# Patient Record
Sex: Male | Born: 1945
Health system: Southern US, Community
[De-identification: ages and names within clinical notes are randomized; demographics above are authoritative.]

## PROBLEM LIST (undated history)

## (undated) DIAGNOSIS — E669 Obesity, unspecified: Secondary | ICD-10-CM

## (undated) DIAGNOSIS — E878 Other disorders of electrolyte and fluid balance, not elsewhere classified: Secondary | ICD-10-CM

## (undated) DIAGNOSIS — H9190 Unspecified hearing loss, unspecified ear: Secondary | ICD-10-CM

## (undated) DIAGNOSIS — Z720 Tobacco use: Secondary | ICD-10-CM

## (undated) DIAGNOSIS — G4733 Obstructive sleep apnea (adult) (pediatric): Secondary | ICD-10-CM

## (undated) DIAGNOSIS — I10 Essential (primary) hypertension: Secondary | ICD-10-CM

## (undated) DIAGNOSIS — M109 Gout, unspecified: Secondary | ICD-10-CM

## (undated) DIAGNOSIS — N189 Chronic kidney disease, unspecified: Secondary | ICD-10-CM

## (undated) DIAGNOSIS — J449 Chronic obstructive pulmonary disease, unspecified: Secondary | ICD-10-CM

## (undated) DIAGNOSIS — R011 Cardiac murmur, unspecified: Secondary | ICD-10-CM

## (undated) DIAGNOSIS — M1990 Unspecified osteoarthritis, unspecified site: Secondary | ICD-10-CM

## (undated) DIAGNOSIS — I480 Paroxysmal atrial fibrillation: Secondary | ICD-10-CM

## (undated) DIAGNOSIS — E1140 Type 2 diabetes mellitus with diabetic neuropathy, unspecified: Secondary | ICD-10-CM

## (undated) HISTORY — DX: Unspecified osteoarthritis, unspecified site: M19.90

## (undated) HISTORY — DX: Gout, unspecified: M10.9

## (undated) HISTORY — PX: SINUS EXPLORATION: SHX5214

## (undated) HISTORY — PX: APPENDECTOMY: SHX54

## (undated) HISTORY — PX: OTHER SURGICAL HISTORY: SHX169

## (undated) HISTORY — PX: HERNIA REPAIR: SHX51

## (undated) HISTORY — DX: Chronic kidney disease, unspecified: N18.9

## (undated) HISTORY — DX: Other disorders of electrolyte and fluid balance, not elsewhere classified: E87.8

---

## 2000-03-04 ENCOUNTER — Other Ambulatory Visit: Admission: RE | Admit: 2000-03-04 | Discharge: 2000-03-04 | Payer: Self-pay | Admitting: Otolaryngology

## 2011-06-25 ENCOUNTER — Inpatient Hospital Stay (HOSPITAL_COMMUNITY)
Admission: EM | Admit: 2011-06-25 | Discharge: 2011-06-26 | DRG: 683 | Disposition: A | Discharge status: Home / Self Care | Payer: Medicare Other | Attending: Internal Medicine | Admitting: Internal Medicine

## 2011-06-25 ENCOUNTER — Other Ambulatory Visit: Payer: Self-pay

## 2011-06-25 ENCOUNTER — Emergency Department (HOSPITAL_COMMUNITY): Payer: Medicare Other

## 2011-06-25 ENCOUNTER — Encounter (HOSPITAL_COMMUNITY): Payer: Self-pay | Admitting: Emergency Medicine

## 2011-06-25 DIAGNOSIS — N179 Acute kidney failure, unspecified: Principal | ICD-10-CM | POA: Diagnosis present

## 2011-06-25 DIAGNOSIS — N19 Unspecified kidney failure: Secondary | ICD-10-CM

## 2011-06-25 DIAGNOSIS — E1142 Type 2 diabetes mellitus with diabetic polyneuropathy: Secondary | ICD-10-CM | POA: Diagnosis present

## 2011-06-25 DIAGNOSIS — Z6841 Body Mass Index (BMI) 40.0 and over, adult: Secondary | ICD-10-CM

## 2011-06-25 DIAGNOSIS — E1149 Type 2 diabetes mellitus with other diabetic neurological complication: Secondary | ICD-10-CM | POA: Diagnosis present

## 2011-06-25 DIAGNOSIS — R011 Cardiac murmur, unspecified: Secondary | ICD-10-CM | POA: Diagnosis present

## 2011-06-25 DIAGNOSIS — Z720 Tobacco use: Secondary | ICD-10-CM | POA: Diagnosis present

## 2011-06-25 DIAGNOSIS — E669 Obesity, unspecified: Secondary | ICD-10-CM | POA: Diagnosis present

## 2011-06-25 DIAGNOSIS — G4733 Obstructive sleep apnea (adult) (pediatric): Secondary | ICD-10-CM | POA: Diagnosis present

## 2011-06-25 DIAGNOSIS — J4489 Other specified chronic obstructive pulmonary disease: Secondary | ICD-10-CM | POA: Diagnosis present

## 2011-06-25 DIAGNOSIS — E119 Type 2 diabetes mellitus without complications: Secondary | ICD-10-CM | POA: Diagnosis present

## 2011-06-25 DIAGNOSIS — Z794 Long term (current) use of insulin: Secondary | ICD-10-CM

## 2011-06-25 DIAGNOSIS — I959 Hypotension, unspecified: Secondary | ICD-10-CM | POA: Diagnosis present

## 2011-06-25 DIAGNOSIS — F17200 Nicotine dependence, unspecified, uncomplicated: Secondary | ICD-10-CM | POA: Diagnosis present

## 2011-06-25 DIAGNOSIS — I480 Paroxysmal atrial fibrillation: Secondary | ICD-10-CM | POA: Diagnosis present

## 2011-06-25 DIAGNOSIS — J449 Chronic obstructive pulmonary disease, unspecified: Secondary | ICD-10-CM | POA: Diagnosis present

## 2011-06-25 DIAGNOSIS — Z7901 Long term (current) use of anticoagulants: Secondary | ICD-10-CM

## 2011-06-25 DIAGNOSIS — J329 Chronic sinusitis, unspecified: Secondary | ICD-10-CM | POA: Diagnosis present

## 2011-06-25 DIAGNOSIS — H9190 Unspecified hearing loss, unspecified ear: Secondary | ICD-10-CM | POA: Diagnosis present

## 2011-06-25 DIAGNOSIS — Z79899 Other long term (current) drug therapy: Secondary | ICD-10-CM

## 2011-06-25 DIAGNOSIS — E1140 Type 2 diabetes mellitus with diabetic neuropathy, unspecified: Secondary | ICD-10-CM | POA: Diagnosis present

## 2011-06-25 DIAGNOSIS — I4891 Unspecified atrial fibrillation: Secondary | ICD-10-CM | POA: Diagnosis present

## 2011-06-25 DIAGNOSIS — E861 Hypovolemia: Secondary | ICD-10-CM | POA: Diagnosis present

## 2011-06-25 DIAGNOSIS — I10 Essential (primary) hypertension: Secondary | ICD-10-CM | POA: Diagnosis present

## 2011-06-25 HISTORY — DX: Obesity, unspecified: E66.9

## 2011-06-25 HISTORY — DX: Essential (primary) hypertension: I10

## 2011-06-25 HISTORY — DX: Unspecified hearing loss, unspecified ear: H91.90

## 2011-06-25 HISTORY — DX: Cardiac murmur, unspecified: R01.1

## 2011-06-25 HISTORY — DX: Type 2 diabetes mellitus with diabetic neuropathy, unspecified: E11.40

## 2011-06-25 HISTORY — DX: Tobacco use: Z72.0

## 2011-06-25 HISTORY — DX: Chronic obstructive pulmonary disease, unspecified: J44.9

## 2011-06-25 HISTORY — DX: Obstructive sleep apnea (adult) (pediatric): G47.33

## 2011-06-25 HISTORY — DX: Paroxysmal atrial fibrillation: I48.0

## 2011-06-25 MED ORDER — SODIUM CHLORIDE 0.9 % IV SOLN
Freq: Once | INTRAVENOUS | Status: AC
  Administered 2011-06-25: via INTRAVENOUS

## 2011-06-25 NOTE — ED Provider Notes (Signed)
History  Scribed for Leota Jacobsen, MD, the patient was seen in room APA09/APA09. This chart was scribed by Truddie Coco. The patient's care started at 11:10 PM    CSN: [id number]  Arrival date & time 06/25/11  2130   First MD Initiated Contact with Patient 06/25/11 2301      Chief Complaint  Patient presents with  . Hypotension     The history is provided by the patient.   Craig Black is a 66 y.o. male who presents to the Emergency Department complaining of generalized weakness and hypotension.  Pt states that for the past week he has felt more tired than usual and three hours ago felt lightheaded and reports his blood pressure was 80/60 at the time.  He denies headache, abdominal pain, chest pain, or SOB.  He states that he feels like he is staggering while walking.  Pt also reports that he was started on coumadin three months ago.    He denies any other sx.  He reports h/o heart murmur.  Nothing seems to improve or worsen sx.      Past Medical History  Diagnosis Date  . Murmur   . Hypertension   . Diabetes mellitus     Past Surgical History  Procedure Date  . Appendectomy   . Hernia repair     No family history on file.  History  Substance Use Topics  . Smoking status: Current Everyday Smoker -- 2.0 packs/day  . Smokeless tobacco: Not on file  . Alcohol Use: No      Review of Systems  Constitutional: Positive for activity change.  Respiratory: Negative for shortness of breath.   Cardiovascular: Negative for chest pain.  Gastrointestinal: Positive for abdominal pain.  Neurological: Positive for weakness. Negative for headaches.  All other systems reviewed and are negative.    Allergies  Review of patient's allergies indicates no known allergies.  Home Medications  No current outpatient prescriptions on file.  BP 144/69  Pulse 103  Temp(Src) 97.9 F (36.6 C) (Oral)  Resp 20  Ht 5\' 6"  (1.676 m)  Wt 265 lb (120.203 kg)  BMI 42.77 kg/m2  SpO2  98%  Physical Exam  Nursing note and vitals reviewed. Constitutional: He is oriented to person, place, and time. He appears well-developed and well-nourished. No distress.  HENT:  Head: Normocephalic and atraumatic.  Eyes: EOM are normal. Right eye exhibits no discharge. Left eye exhibits no discharge.  Neck: Normal range of motion. Neck supple.  Cardiovascular:  No murmur heard.      irregular  Pulmonary/Chest: Effort normal. He has no wheezes. He has no rales.  Musculoskeletal: He exhibits no tenderness.  Neurological: He is alert and oriented to person, place, and time.  Skin: Skin is warm and dry. He is not diaphoretic.  Psychiatric: He has a normal mood and affect. His behavior is normal.    ED Course  Procedures   DIAGNOSTIC STUDIES: Oxygen Saturation is 98% on room air, normal by my interpretation.    COORDINATION OF CARE:  11:17PM Ordered: Comprehensive metabolic panel ; EKG [id number] ; Orthostatic vital signs ; 0.9 % sodium chloride infusion ; DG Chest 2 View ; Protime-INR ; Sample to Blood Bank    Labs Reviewed - No data to display No results found.   No diagnosis found.    MDM   Date: 06/25/2011  Rate: 96  Rhythm: atrial fibrillation  QRS Axis: normal  Intervals: normal  ST/T Wave abnormalities: nonspecific T  wave changes  Conduction Disutrbances:atrial fibrillation  Narrative Interpretation:   Old EKG Reviewed: none available   I personally performed the services described in this documentation, which was scribed in my presence. The recorded information has been reviewed and considered.   3:23 AM Patient given IV fluids here. Patient's renal function noted and he will be admitted for hydration     Leota Jacobsen, MD 06/26/11 [phone]

## 2011-06-25 NOTE — ED Notes (Signed)
Pt states he was hypotensive at 80/50 over the last two days.

## 2011-06-26 ENCOUNTER — Encounter (HOSPITAL_COMMUNITY): Payer: Self-pay | Admitting: Internal Medicine

## 2011-06-26 ENCOUNTER — Inpatient Hospital Stay (HOSPITAL_COMMUNITY): Payer: Medicare Other

## 2011-06-26 DIAGNOSIS — E1140 Type 2 diabetes mellitus with diabetic neuropathy, unspecified: Secondary | ICD-10-CM | POA: Diagnosis present

## 2011-06-26 DIAGNOSIS — Z720 Tobacco use: Secondary | ICD-10-CM | POA: Diagnosis present

## 2011-06-26 DIAGNOSIS — E669 Obesity, unspecified: Secondary | ICD-10-CM | POA: Diagnosis present

## 2011-06-26 DIAGNOSIS — J329 Chronic sinusitis, unspecified: Secondary | ICD-10-CM | POA: Diagnosis present

## 2011-06-26 DIAGNOSIS — I10 Essential (primary) hypertension: Secondary | ICD-10-CM | POA: Diagnosis present

## 2011-06-26 DIAGNOSIS — N179 Acute kidney failure, unspecified: Secondary | ICD-10-CM | POA: Diagnosis present

## 2011-06-26 DIAGNOSIS — I480 Paroxysmal atrial fibrillation: Secondary | ICD-10-CM | POA: Diagnosis present

## 2011-06-26 DIAGNOSIS — J449 Chronic obstructive pulmonary disease, unspecified: Secondary | ICD-10-CM | POA: Diagnosis present

## 2011-06-26 DIAGNOSIS — G4733 Obstructive sleep apnea (adult) (pediatric): Secondary | ICD-10-CM | POA: Diagnosis present

## 2011-06-26 DIAGNOSIS — E119 Type 2 diabetes mellitus without complications: Secondary | ICD-10-CM | POA: Diagnosis present

## 2011-06-26 DIAGNOSIS — H9190 Unspecified hearing loss, unspecified ear: Secondary | ICD-10-CM | POA: Diagnosis present

## 2011-06-26 LAB — COMPREHENSIVE METABOLIC PANEL
AST: 19 U/L (ref 0–37)
AST: 15 U/L (ref 0–37)
Albumin: 3.7 g/dL (ref 3.5–5.2)
BUN: 44 mg/dL — ABNORMAL HIGH (ref 6–23)
CO2: 24 mEq/L (ref 19–32)
Calcium: 9.2 mg/dL (ref 8.4–10.5)
Calcium: 8.8 mg/dL (ref 8.4–10.5)
Chloride: 105 mEq/L (ref 96–112)
Creatinine, Ser: 2.17 mg/dL — ABNORMAL HIGH (ref 0.50–1.35)
Creatinine, Ser: 1.96 mg/dL — ABNORMAL HIGH (ref 0.50–1.35)
GFR calc Af Amer: 39 mL/min — ABNORMAL LOW (ref >90)
GFR calc non Af Amer: 34 mL/min — ABNORMAL LOW (ref >90)
Glucose, Bld: 207 mg/dL — ABNORMAL HIGH (ref 70–99)
Total Bilirubin: 0.2 mg/dL — ABNORMAL LOW (ref 0.3–1.2)
Total Protein: 7.1 g/dL (ref 6.0–8.3)

## 2011-06-26 LAB — DIFFERENTIAL
Basophils Absolute: 0.1 10*3/uL (ref 0.0–0.1)
Basophils Relative: 1 % (ref 0–1)
Eosinophils Relative: 3 % (ref 0–5)
Monocytes Absolute: 0.8 10*3/uL (ref 0.1–1.0)
Neutro Abs: 5.6 10*3/uL (ref 1.7–7.7)

## 2011-06-26 LAB — HEMOGLOBIN A1C
Hgb A1c MFr Bld: 6.4 % — ABNORMAL HIGH (ref <5.7)
Mean Plasma Glucose: 137 mg/dL — ABNORMAL HIGH (ref <117)

## 2011-06-26 LAB — CBC
HCT: 40.2 % (ref 39.0–52.0)
MCHC: 35.6 g/dL (ref 30.0–36.0)
MCV: 92.2 fL (ref 78.0–100.0)
RDW: 13.9 % (ref 11.5–15.5)

## 2011-06-26 LAB — GLUCOSE, CAPILLARY: Glucose-Capillary: 187 mg/dL — ABNORMAL HIGH (ref 70–99)

## 2011-06-26 LAB — CARDIAC PANEL(CRET KIN+CKTOT+MB+TROPI): Relative Index: INVALID (ref 0.0–2.5)

## 2011-06-26 LAB — APTT: aPTT: 34 seconds (ref 24–37)

## 2011-06-26 LAB — SAMPLE TO BLOOD BANK

## 2011-06-26 LAB — PROTIME-INR: INR: 1.65 — ABNORMAL HIGH (ref 0.00–1.49)

## 2011-06-26 MED ORDER — ACETAMINOPHEN 325 MG PO TABS: 650.0000 mg | ORAL_TABLET | Freq: Four times a day (QID) | ORAL | Status: DC | PRN

## 2011-06-26 MED ORDER — SODIUM CHLORIDE 0.9 % IJ SOLN: 3.0000 mL | Freq: Two times a day (BID) | INTRAMUSCULAR | Status: DC

## 2011-06-26 MED ORDER — IPRATROPIUM BROMIDE 0.02 % IN SOLN
0.5000 mg | RESPIRATORY_TRACT | Status: DC
  Administered 2011-06-26: 0.5 mg via RESPIRATORY_TRACT
  Filled 2011-06-26 (×2): qty 2.5

## 2011-06-26 MED ORDER — TRAZODONE HCL 50 MG PO TABS: 25.0000 mg | ORAL_TABLET | Freq: Every evening | ORAL | Status: DC | PRN

## 2011-06-26 MED ORDER — WARFARIN SODIUM 10 MG PO TABS: 10.0000 mg | ORAL_TABLET | Freq: Once | ORAL | Status: DC

## 2011-06-26 MED ORDER — GABAPENTIN 300 MG PO CAPS
300.0000 mg | ORAL_CAPSULE | Freq: Two times a day (BID) | ORAL | Status: DC
  Administered 2011-06-26: 300 mg via ORAL
  Filled 2011-06-26: qty 1

## 2011-06-26 MED ORDER — ONDANSETRON HCL 4 MG PO TABS: 4.0000 mg | ORAL_TABLET | Freq: Four times a day (QID) | ORAL | Status: DC | PRN

## 2011-06-26 MED ORDER — INSULIN ASPART PROT & ASPART (70-30 MIX) 100 UNIT/ML ~~LOC~~ SUSP
35.0000 [IU] | Freq: Two times a day (BID) | SUBCUTANEOUS | Status: DC
  Administered 2011-06-26: 35 [IU] via SUBCUTANEOUS
  Filled 2011-06-26: qty 10
  Filled 2011-06-26: qty 3

## 2011-06-26 MED ORDER — ONDANSETRON HCL 4 MG/2ML IJ SOLN: 4.0000 mg | Freq: Four times a day (QID) | INTRAMUSCULAR | Status: DC | PRN

## 2011-06-26 MED ORDER — INSULIN ASPART 100 UNIT/ML ~~LOC~~ SOLN
0.0000 [IU] | Freq: Three times a day (TID) | SUBCUTANEOUS | Status: DC
  Administered 2011-06-26: 4 [IU] via SUBCUTANEOUS

## 2011-06-26 MED ORDER — INSULIN ASPART PROT & ASPART (70-30 MIX) 100 UNIT/ML ~~LOC~~ SUSP: 40.0000 [IU] | Freq: Two times a day (BID) | SUBCUTANEOUS | Status: DC

## 2011-06-26 MED ORDER — SODIUM CHLORIDE 0.9 % IV SOLN
INTRAVENOUS | Status: DC
  Administered 2011-06-26: 06:00:00 via INTRAVENOUS

## 2011-06-26 MED ORDER — HYDROMORPHONE HCL PF 1 MG/ML IJ SOLN: 0.5000 mg | INTRAMUSCULAR | Status: DC | PRN

## 2011-06-26 MED ORDER — OXYCODONE HCL 5 MG PO TABS: 5.0000 mg | ORAL_TABLET | ORAL | Status: DC | PRN

## 2011-06-26 MED ORDER — ACETAMINOPHEN 650 MG RE SUPP: 650.0000 mg | Freq: Four times a day (QID) | RECTAL | Status: DC | PRN

## 2011-06-26 MED ORDER — ATORVASTATIN CALCIUM 20 MG PO TABS: 20.0000 mg | ORAL_TABLET | Freq: Every day | ORAL | Status: DC

## 2011-06-26 MED ORDER — ASPIRIN EC 81 MG PO TBEC
81.0000 mg | DELAYED_RELEASE_TABLET | Freq: Every day | ORAL | Status: DC
  Administered 2011-06-26: 81 mg via ORAL
  Filled 2011-06-26: qty 1

## 2011-06-26 MED ORDER — ALBUTEROL SULFATE (5 MG/ML) 0.5% IN NEBU
2.5000 mg | INHALATION_SOLUTION | Freq: Four times a day (QID) | RESPIRATORY_TRACT | Status: DC | PRN
  Administered 2011-06-26: 2.5 mg via RESPIRATORY_TRACT
  Filled 2011-06-26 (×2): qty 0.5

## 2011-06-26 MED ORDER — SODIUM CHLORIDE 0.9 % IV BOLUS (SEPSIS)
1000.0000 mL | Freq: Once | INTRAVENOUS | Status: AC
  Administered 2011-06-26: 1000 mL via INTRAVENOUS

## 2011-06-26 MED ORDER — INSULIN ASPART 100 UNIT/ML ~~LOC~~ SOLN: 0.0000 [IU] | Freq: Every day | SUBCUTANEOUS | Status: DC

## 2011-06-26 MED ORDER — METOPROLOL TARTRATE 50 MG PO TABS: 50.0000 mg | ORAL_TABLET | Freq: Two times a day (BID) | ORAL | Status: AC

## 2011-06-26 MED ORDER — FLUTICASONE PROPIONATE 50 MCG/ACT NA SUSP
2.0000 | Freq: Every day | NASAL | Status: DC
  Administered 2011-06-26: 2 via NASAL
  Filled 2011-06-26: qty 16

## 2011-06-26 MED ORDER — WARFARIN - PHARMACIST DOSING INPATIENT: Freq: Every day | Status: DC

## 2011-06-26 NOTE — ED Notes (Signed)
Patient lying in bed sleeping at this time. No complaints of pain or obvious distress noted.

## 2011-06-26 NOTE — H&P (Signed)
PCP:   Dr. Vicenta Aly at the [facility] outpatient clinic   Chief Complaint:  Feeling faint yesterday evening   Progressive weakness or 2 days; neqarly fainted 5pm  Started mucinex DM 2 days ago  Sinuses draining x 6-7 years  Dr. Dr. Vicenta Aly, [facility]      HPI: Craig Black is an 66 y.o. male.  [place] veteran, diabetic, hypertensive, morbid obesity, obstructive sleep apnea, compliant with CPAP. Medications include lisinopril and HCTZ.  Has been feeling tired and fatigued for the past 2 days, then at about 5 PM yesterday evening he passed out; checked his blood pressure and found it to be in the [id number] systolic, and that caused him to come to the emergency room to be evaluated.   Emergency room he received intravenous fluid resuscitation and feels much better feels he could go home, but blood work reveals evidence of renal failure and the hospitalist service has been called to assist with management.  She denies any history of kidney disease, he denies any over-the-counter medication use such as Advil or Aleve, but has been using Mucinex for his chronic. Denies vomiting or diarrhea; denies any black or bloody stool.  Has been taking his antihypertensives has prescribed, does not think it is possible that he took more than the prescribed dose; in fact he says his lisinopril was recently reduced because it gave him palpitations at the full dose.  Does take Coumadin for axis more atrial fibrillation.  Does smoke one half packs of cigarettes per day and has been smoking since about age 39.  Because of his experiences in [place] he is hard of hearing in his right ear.  Rewiew of Systems:  The patient denies anorexia, fever, weight loss,, vision loss, decreased hearing, hoarseness, chest pain, syncope, dyspnea on exertion, peripheral edema, balance deficits, hemoptysis, abdominal pain, melena, hematochezia, severe indigestion/heartburn, hematuria, incontinence, genital sores, muscle weakness,  suspicious skin lesions, transient blindness, difficulty walking, depression, unusual weight change, abnormal bleeding, enlarged lymph nodes, angioedema, and breast masses.   Past Medical History  Diagnosis Date  . Murmur   . Hypertension   . Diabetes mellitus   . Chronic sinusitis   . OSA (obstructive sleep apnea)   . Paroxysmal a-fib   . Neuropathy in diabetes   . COPD (chronic obstructive pulmonary disease)   . Tobacco abuse   . Hard of hearing     Right ear  . Obesity     Past Surgical History  Procedure Date  . Appendectomy   . Hernia repair   . Cataract     right lens replacement  . Sinus exploration     1998    Medications:  HOME MEDS: Prior to Admission medications   Medication Sig Start Date End Date Taking? Authorizing Provider  albuterol (PROVENTIL HFA) 108 (90 BASE) MCG/ACT inhaler Inhale 2 puffs into the lungs every 6 (six) hours as needed.   Yes Historical Provider, MD  amLODipine (NORVASC) 10 MG tablet Take 10 mg by mouth daily.   Yes Historical Provider, MD  fluticasone (FLONASE) 50 MCG/ACT nasal spray Place 2 sprays into the nose daily.   Yes Historical Provider, MD  gabapentin (NEURONTIN) 300 MG capsule Take 300 mg by mouth 2 (two) times daily.   Yes Historical Provider, MD  hydrochlorothiazide (HYDRODIURIL) 25 MG tablet Take 25 mg by mouth every morning.   Yes Historical Provider, MD  insulin aspart (NOVOLOG) 100 UNIT/ML injection Inject 10 Units into the skin 3 (three) times daily  before meals.   Yes Historical Provider, MD  insulin NPH-insulin regular (NOVOLIN 70/30) (70-30) 100 UNIT/ML injection Inject 40 Units into the skin daily with supper.   Yes Historical Provider, MD  insulin NPH-insulin regular (NOVOLIN 70/30) (70-30) 100 UNIT/ML injection Inject 50 Units into the skin daily with breakfast.   Yes Historical Provider, MD  lisinopril (PRINIVIL,ZESTRIL) 40 MG tablet Take 20 mg by mouth every morning.   Yes Historical Provider, MD  Multiple  Vitamins-Minerals (CENTRUM SILVER ULTRA MENS PO) Take 1 tablet by mouth every morning.   Yes Historical Provider, MD  rosuvastatin (CRESTOR) 20 MG tablet Take 10 mg by mouth every evening.   Yes Historical Provider, MD  warfarin (COUMADIN) 10 MG tablet Take 10 mg by mouth daily. Take one & one-half tablet on Mondays and Fridays. Take one tablet on all other days   Yes Historical Provider, MD     Allergies:  No Known Allergies  Social History:   reports that he has been smoking Cigarettes.  He has a 90 pack-year smoking history. He does not have any smokeless tobacco history on file. He reports that he does not drink alcohol or use illicit drugs.  Family History: Family History  Problem Relation Age of Onset  . Adopted: Yes     Physical Exam: Filed Vitals:   06/25/11 2138 06/26/11 0000 06/26/11 0002 06/26/11 0003  BP: 144/69 133/73 128/62 116/57  Pulse: 103 98 92 107  Temp: 97.9 F (36.6 C)     TempSrc: Oral     Resp: 20     Height: 5\' 6"  (1.676 m)     Weight: 120.203 kg (265 lb)     SpO2: 98%      Blood pressure 116/57, pulse 107, temperature 97.9 F (36.6 C), temperature source Oral, resp. rate 20, height 5\' 6"  (1.676 m), weight 120.203 kg (265 lb), SpO2 98.00%.  GEN:  Pleasant morbidly obese Caucasian man lying in the stretcher in no acute distress; cooperative with exam PSYCH:  alert and oriented x4; does not appear anxious does not appear depressed; affect is normal HEENT: Mucous membranes pink dry and anicteric; PERRLA; EOM intact; very thick neck no cervical lymphadenopathy nor thyromegaly or carotid bruit; no JVD; Breasts:: Not examined CHEST WALL: No tenderness CHEST: Normal respiration, clear to auscultation bilaterally HEART: Irregularly irregular rhythm; no murmurs rubs or gallops BACK: No kyphosis or scoliosis; no CVA tenderness ABDOMEN: Obese, soft non-tender; no masses, no organomegaly, normal abdominal bowel sounds;  no intertriginous candida. Rectal Exam:  Not done EXTREMITIES: ; age-appropriate arthropathy of the hands and knees; no edema; no ulcerations. Genitalia: not examined PULSES: 2+ and symmetric SKIN: Normal hydration no rash or ulceration CNS: Cranial nerves 2-12 grossly intact no focal neurologic deficit   Labs & Imaging Results for orders placed during the hospital encounter of 06/25/11 (from the past 48 hour(s))  CBC     Status: Normal   Collection Time   06/25/11 11:18 PM      Component Value Range Comment   WBC 9.5  4.0 - 10.5 (K/uL)    RBC 4.36  4.22 - 5.81 (MIL/uL)    Hemoglobin 14.3  13.0 - 17.0 (g/dL)    HCT 40.2  39.0 - 52.0 (%)    MCV 92.2  78.0 - 100.0 (fL)    MCH 32.8  26.0 - 34.0 (pg)    MCHC 35.6  30.0 - 36.0 (g/dL)    RDW 13.9  11.5 - 15.5 (%)    Platelets  229  150 - 400 (K/uL)   DIFFERENTIAL     Status: Normal   Collection Time   06/25/11 11:18 PM      Component Value Range Comment   Neutrophils Relative 58  43 - 77 (%)    Neutro Abs 5.6  1.7 - 7.7 (K/uL)    Lymphocytes Relative 30  12 - 46 (%)    Lymphs Abs 2.8  0.7 - 4.0 (K/uL)    Monocytes Relative 9  3 - 12 (%)    Monocytes Absolute 0.8  0.1 - 1.0 (K/uL)    Eosinophils Relative 3  0 - 5 (%)    Eosinophils Absolute 0.2  0.0 - 0.7 (K/uL)    Basophils Relative 1  0 - 1 (%)    Basophils Absolute 0.1  0.0 - 0.1 (K/uL)   COMPREHENSIVE METABOLIC PANEL     Status: Abnormal   Collection Time   06/25/11 11:18 PM      Component Value Range Comment   Sodium 140  135 - 145 (mEq/L) POST-ULTRACENTRIFUGATION   Potassium 4.3  3.5 - 5.1 (mEq/L)    Chloride 102  96 - 112 (mEq/L)    CO2 21  19 - 32 (mEq/L)    Glucose, Bld 92  70 - 99 (mg/dL)    BUN 44 (*) 6 - 23 (mg/dL)    Creatinine, Ser 2.17 (*) 0.50 - 1.35 (mg/dL)    Calcium 9.2  8.4 - 10.5 (mg/dL)    Total Protein 7.1  6.0 - 8.3 (g/dL)    Albumin 3.7  3.5 - 5.2 (g/dL)    AST 19  0 - 37 (U/L)    ALT 19  0 - 53 (U/L)    Alkaline Phosphatase 70  39 - 117 (U/L)    Total Bilirubin 0.2 (*) 0.3 - 1.2 (mg/dL)      GFR calc non Af Amer 30 (*) >90 (mL/min)    GFR calc Af Amer 35 (*) >90 (mL/min)   PROTIME-INR     Status: Abnormal   Collection Time   06/25/11 11:18 PM      Component Value Range Comment   Prothrombin Time 19.8 (*) 11.6 - 15.2 (seconds)    INR 1.65 (*) 0.00 - 1.49    SAMPLE TO BLOOD BANK     Status: Normal   Collection Time   06/26/11 12:24 AM      Component Value Range Comment   Blood Bank Specimen SAMPLE AVAILABLE FOR TESTING      Sample Expiration 06/29/2011      Dg Chest 2 View  06/26/2011  *RADIOLOGY REPORT*  Clinical Data: Hypotension  CHEST - 2 VIEW  Comparison: None.  Findings: Normal heart size.  Left base nodular density.  Lungs otherwise clear.  No pneumothorax and no pleural effusion.  Right costophrenic angle not included on the PA view limiting the exam.  IMPRESSION: Left base nodular density.  Repeat with nipple markers is recommended.  Original Report Authenticated By: Jamas Lav, M.D.      Assessment Present on Admission:  .Diabetes mellitus .Acute renal failure .Hypertension .Chronic sinusitis .OSA (obstructive sleep apnea) .Paroxysmal a-fib .Neuropathy in diabetes .COPD (chronic obstructive pulmonary disease) .Tobacco abuse .Hard of hearing .Obesity   PLAN: Admit this gentleman for hydration, discontinue nephrotoxic drugs for the time being; does have potassium other part of his medications but has not been taking them.  Temporarily Reduce his dose 70/30 insulin while he is in renal failure.  And son the  importance of weight loss and tobacco cessation during his many chronic medical problems.  Have pharmacy assist with dosing his Coumadin since his INR is subtherapeutic  She'll assistance with finding a local doctor who takes Medicaid, so consult a social worker has been sent  Other plans as per orders.    Manolito Jurewicz 06/26/2011, 4:21 AM

## 2011-06-26 NOTE — Progress Notes (Signed)
Discharge instructions reviewed with patient, patient voiced understanding. Patient given discharge instructions and prescriptions. Patient in stable condition.

## 2011-06-26 NOTE — ED Notes (Signed)
Dr Campbell at bedside.

## 2011-06-26 NOTE — Consult Note (Signed)
ANTICOAGULATION CONSULT NOTE - Initial Consult  Pharmacy Consult for Warfarin  Indication: atrial fibrillation  No Known Allergies  Patient Measurements: Height: 5\' 6"  (167.6 cm) Weight: 276 lb 3.2 oz (125.283 kg) IBW/kg (Calculated) : 63.8   Vital Signs: Temp: 97.8 F (36.6 C) (03/13 0533) Temp src: Oral (03/13 0533) BP: 154/67 mmHg (03/13 0534) Pulse Rate: 103  (03/13 0534)  Labs:  Basename 06/26/11 0550 06/26/11 0520 06/25/11 2318  HGB -- -- 14.3  HCT -- -- 40.2  PLT -- -- 229  APTT -- 34 --  LABPROT -- 20.5* 19.8*  INR -- 1.72* 1.65*  HEPARINUNFRC -- -- --  CREATININE 1.96* -- 2.17*  CKTOTAL -- 41 --  CKMB -- 1.5 --  TROPONINI -- <0.30 --   Estimated Creatinine Clearance: 46.4 ml/min (by C-G formula based on Cr of 1.96).  Medical History: Past Medical History  Diagnosis Date  . Murmur   . Hypertension   . Diabetes mellitus   . Chronic sinusitis   . OSA (obstructive sleep apnea)   . Paroxysmal Black-fib   . Neuropathy in diabetes   . COPD (chronic obstructive pulmonary disease)   . Tobacco abuse   . Hard of hearing     Right ear  . Obesity    Medications:  Prescriptions prior to admission  Medication Sig Dispense Refill  . albuterol (PROVENTIL HFA) 108 (90 BASE) MCG/ACT inhaler Inhale 2 puffs into the lungs every 6 (six) hours as needed.      Marland Kitchen amLODipine (NORVASC) 10 MG tablet Take 10 mg by mouth daily.      . fluticasone (FLONASE) 50 MCG/ACT nasal spray Place 2 sprays into the nose daily.      Marland Kitchen gabapentin (NEURONTIN) 300 MG capsule Take 300 mg by mouth 2 (two) times daily.      . hydrochlorothiazide (HYDRODIURIL) 25 MG tablet Take 25 mg by mouth every morning.      . insulin aspart (NOVOLOG) 100 UNIT/ML injection Inject 10 Units into the skin 3 (three) times daily before meals.      . insulin NPH-insulin regular (NOVOLIN 70/30) (70-30) 100 UNIT/ML injection Inject 40 Units into the skin daily with supper.      . insulin NPH-insulin regular (NOVOLIN  70/30) (70-30) 100 UNIT/ML injection Inject 50 Units into the skin daily with breakfast.      . lisinopril (PRINIVIL,ZESTRIL) 40 MG tablet Take 20 mg by mouth every morning.      . Multiple Vitamins-Minerals (CENTRUM SILVER ULTRA MENS PO) Take 1 tablet by mouth every morning.      . rosuvastatin (CRESTOR) 20 MG tablet Take 10 mg by mouth every evening.      . warfarin (COUMADIN) 10 MG tablet Take 10 mg by mouth daily. Take one & one-half tablet on Mondays and Fridays. Take one tablet on all other days        Assessment: INR below goal  Goal of Therapy:  INR 2-3   Plan: Coumadin 10mg  today x 1 INR daily until stable  Craig Black 06/26/2011,8:01 AM

## 2011-06-26 NOTE — Discharge Summary (Signed)
Physician Discharge Summary  Patient ID: Craig Black MRN: [id number] DOB/AGE: 1945-11-28 66 y.o. y.o.  Admit date: 06/25/2011 Discharge date: 06/26/2011    Discharge Diagnoses:  1. Hypotension secondary to hypovolemia/dehydration. Improved. 2. Acute renal failure, improving, secondary to #1. 3. Obesity. 4. Hypertension. 5. COPD. 6. Type 2 diabetes mellitus with peripheral neuropathy. 7. Obstructive sleep apnea. 8. Tobacco abuse.   Medication List  As of 06/26/2011  9:37 AM   STOP taking these medications         CENTRUM SILVER ULTRA MENS PO      hydrochlorothiazide 25 MG tablet      lisinopril 40 MG tablet         TAKE these medications         amLODipine 10 MG tablet   Commonly known as: NORVASC   Take 10 mg by mouth daily.      fluticasone 50 MCG/ACT nasal spray   Commonly known as: FLONASE   Place 2 sprays into the nose daily.      gabapentin 300 MG capsule   Commonly known as: NEURONTIN   Take 300 mg by mouth 2 (two) times daily.      insulin aspart 100 UNIT/ML injection   Commonly known as: novoLOG   Inject 10 Units into the skin 3 (three) times daily before meals.      insulin NPH-insulin regular (70-30) 100 UNIT/ML injection   Commonly known as: NOVOLIN 70/30   Inject 40 Units into the skin daily with supper.      insulin NPH-insulin regular (70-30) 100 UNIT/ML injection   Commonly known as: NOVOLIN 70/30   Inject 50 Units into the skin daily with breakfast.      metoprolol 50 MG tablet   Commonly known as: LOPRESSOR   Take 1 tablet (50 mg total) by mouth 2 (two) times daily.      PROVENTIL HFA 108 (90 BASE) MCG/ACT inhaler   Generic drug: albuterol   Inhale 2 puffs into the lungs every 6 (six) hours as needed.      rosuvastatin 20 MG tablet   Commonly known as: CRESTOR   Take 10 mg by mouth every evening.      warfarin 10 MG tablet   Commonly known as: COUMADIN   Take 10 mg by mouth daily. Take one & one-half tablet on Mondays and Fridays. Take  one tablet on all other days            Discharged Condition: Stable.    Consults: None.  Significant Diagnostic Studies: Dg Chest 2 View  06/26/2011  *RADIOLOGY REPORT*  Clinical Data: Hypotension  CHEST - 2 VIEW  Comparison: None.  Findings: Normal heart size.  Left base nodular density.  Lungs otherwise clear.  No pneumothorax and no pleural effusion.  Right costophrenic angle not included on the PA view limiting the exam.  IMPRESSION: Left base nodular density.  Repeat with nipple markers is recommended.  Original Report Authenticated By: Jamas Lav, M.D.    Lab Results: Basic Metabolic Panel:  Basename 06/26/11 0550 06/25/11 2318  NA 140 140  K 3.9 4.3  CL 105 102  CO2 24 21  GLUCOSE 207* 92  BUN 44* 44*  CREATININE 1.96* 2.17*  CALCIUM 8.8 9.2  MG -- --  PHOS -- --   Liver Function Tests:  Basename 06/26/11 0550 06/25/11 2318  AST 15 19  ALT 20 19  ALKPHOS 75 70  BILITOT 0.2* 0.2*  PROT 7.2 7.1  ALBUMIN  3.6 3.7     CBC:  Basename 06/25/11 2318  WBC 9.5  NEUTROABS 5.6  HGB 14.3  HCT 40.2  MCV 92.2  PLT 229       Hospital Course: This 66 year old man was admitted yesterday hypotensive. He did not have an episode of loss of consciousness but almost did. When he presented to the hospital he was hypotensive and in acute renal failure. He was felt to also be clinically dehydrated. He was given intravenous fluids in the emergency room whereupon he began to fell much improved. Overnight has been admitted and has been in IV fluids at 150 cc an hour. He feels back to his usual self now. Recently, his dose of ACE inhibitor was decreased by half because he was feeling lightheaded. It has been discontinued in the hospital. He denies any chest pain, increased dyspnea or palpitations.  Discharge Exam: Blood pressure 154/67, pulse 103, temperature 97.8 F (36.6 C), temperature source Oral, resp. rate 22, height 5\' 6"  (1.676 m), weight 125.283 kg (276 lb 3.2  oz), SpO2 92.00%. He is obese and looks chronically sick. However he is not septic or toxic. Heart sounds are present and normal. Lung fields are clear. There is no wheezing, crackles or bronchial breathing. He is alert and orientated without any focal neurologic signs.  Disposition: Home. His renal function is not back to normal, however I do not know what his baseline is. We have stopped his ACE inhibitor and hydrochlorothiazide. In its place I have given a prescription for metoprolol 50 mg twice a day. She will continue this with Norvasc 10 mg daily. Clearly  his blood pressure and renal function needs to be monitored closely and I have asked him to followup with his primary care physician in the next week or 2.  Discharge Orders    Future Orders Please Complete By Expires   Diet - low sodium heart healthy      Increase activity slowly           SignedDoree Albee Pager [phone]  06/26/2011, 9:37 AM

## 2011-06-26 NOTE — Progress Notes (Signed)
Patient in stable condition and transported out by RN.

## 2011-06-26 NOTE — ED Notes (Signed)
Family at bedside. Patient walked to the restroom and back to bed with no problems.

## 2011-07-30 ENCOUNTER — Emergency Department (HOSPITAL_COMMUNITY)
Admission: EM | Admit: 2011-07-30 | Discharge: 2011-07-30 | Disposition: A | Discharge status: Home / Self Care | Payer: Medicare Other | Attending: Emergency Medicine | Admitting: Emergency Medicine

## 2011-07-30 ENCOUNTER — Encounter (HOSPITAL_COMMUNITY): Payer: Self-pay

## 2011-07-30 ENCOUNTER — Emergency Department (HOSPITAL_COMMUNITY): Payer: Medicare Other

## 2011-07-30 DIAGNOSIS — X500XXA Overexertion from strenuous movement or load, initial encounter: Secondary | ICD-10-CM | POA: Insufficient documentation

## 2011-07-30 DIAGNOSIS — S20219A Contusion of unspecified front wall of thorax, initial encounter: Secondary | ICD-10-CM

## 2011-07-30 DIAGNOSIS — I10 Essential (primary) hypertension: Secondary | ICD-10-CM | POA: Insufficient documentation

## 2011-07-30 DIAGNOSIS — E119 Type 2 diabetes mellitus without complications: Secondary | ICD-10-CM | POA: Insufficient documentation

## 2011-07-30 DIAGNOSIS — R079 Chest pain, unspecified: Secondary | ICD-10-CM | POA: Insufficient documentation

## 2011-07-30 DIAGNOSIS — F17200 Nicotine dependence, unspecified, uncomplicated: Secondary | ICD-10-CM | POA: Insufficient documentation

## 2011-07-30 DIAGNOSIS — Z794 Long term (current) use of insulin: Secondary | ICD-10-CM | POA: Insufficient documentation

## 2011-07-30 DIAGNOSIS — Z7901 Long term (current) use of anticoagulants: Secondary | ICD-10-CM | POA: Insufficient documentation

## 2011-07-30 NOTE — ED Notes (Signed)
C/o right rib pain worse with inspiration. Pt was working on truck and laid on right side and heard " snap"

## 2011-07-30 NOTE — ED Notes (Signed)
Pain rt ribs. No distress,

## 2011-07-30 NOTE — Discharge Instructions (Signed)
Chest Contusion You have been checked for injuries to your chest. Your caregiver has not found injuries serious enough to require hospitalization. It is common to have bruises and sore muscles after an injury. These tend to feel worse the first 24 hours. You may gradually develop more stiffness and soreness over the next several hours to several days. This usually feels worse the first morning following your injury. After a few days, you will usually begin to improve. The amount of improvement depends on the amount of damage. Following the accident, if the pain in any area continues to increase or you develop new areas of pain, you should see your primary caregiver or return to the Emergency Department for re-evaluation. HOME CARE INSTRUCTIONS   Put ice on sore areas every 2 hours for 20 minutes while awake for the next 2 days.   Drink extra fluids. Do not drink alcohol.   Activity as tolerated. Lifting may make pain worse.   Only take over-the-counter or prescription medicines for pain, discomfort, or fever as directed by your caregiver. Do not use aspirin. This may increase bruising or increase bleeding.  SEEK IMMEDIATE MEDICAL CARE IF:   There is a worsening of any of the problems that brought you in for care.   Shortness of breath, dizziness or fainting develop.   You have chest pain, difficulty breathing, or develop pain going down the left arm or up into jaw.   You feel sick to your stomach (nausea), vomiting or sweats.   You have increasing belly (abdominal) discomfort.   There is blood in your urine, stool, or if you vomit blood.   There is pain in either shoulder in an area where a shoulder strap would be.   You have feelings of lightheadedness, or if you should have a fainting episode.   You have numbness, tingling, weakness, or problems with the use of your arms or legs.   Severe headaches not relieved with medications develop.   You have a change in bowel or bladder  control.   There is increasing pain in any areas of the body.  If you feel your symptoms are worsening, and you are not able to see your primary caregiver, return to the Emergency Department immediately. MAKE SURE YOU:   Understand these instructions.   Will watch your condition.   Will get help right away if you are not doing well or get worse.  Document Released: 12/25/2000 Document Revised: 03/21/2011 Document Reviewed: 11/18/2007 [facility] Patient Information 2012 [place].Rib Contusion A rib contusion (bruise) can occur by a blow to the chest or by a fall against a hard object. Usually these will be much better in a couple weeks. If X-rays were taken today and there are no broken bones (fractures), the diagnosis of bruising is made. However, broken ribs may not show up for several days, or may be discovered later on a routine X-ray when signs of healing show up. If this happens to you, it does not mean that something was missed on the X-ray, but simply that it did not show up on the first X-rays. Earlier diagnosis will not usually change the treatment. HOME CARE INSTRUCTIONS   Avoid strenuous activity. Be careful during activities and avoid bumping the injured ribs. Activities that pull on the injured ribs and cause pain should be avoided, if possible.   For the first day or two, an ice pack used every 20 minutes while awake may be helpful. Put ice in a plastic bag  and put a towel between the bag and the skin.   Eat a normal, well-balanced diet. Drink plenty of fluids to avoid constipation.   Take deep breaths several times a day to keep lungs free of infection. Try to cough several times a day. Splint the injured area with a pillow while coughing to ease pain. Coughing can help prevent pneumonia.   Wear a rib belt or binder only if told to do so by your caregiver. If you are wearing a rib belt or binder, you must do the breathing exercises as directed by your caregiver. If not  used properly, rib belts or binders restrict breathing which can lead to pneumonia.   Only take over-the-counter or prescription medicines for pain, discomfort, or fever as directed by your caregiver.  SEEK MEDICAL CARE IF:   You or your child has an oral temperature above 102 F (38.9 C).   Your baby is older than 3 months with a rectal temperature of 100.5 F (38.1 C) or higher for more than 1 day.   You develop a cough, with thick or bloody sputum.  SEEK IMMEDIATE MEDICAL CARE IF:   You have difficulty breathing.   You feel sick to your stomach (nausea), have vomiting or belly (abdominal) pain.   You have worsening pain, not controlled with medications, or there is a change in the location of the pain.   You develop sweating or radiation of the pain into the arms, jaw or shoulders, or become light headed or faint.   You or your child has an oral temperature above 102 F (38.9 C), not controlled by medicine.   Your or your baby is older than 3 months with a rectal temperature of 102 F (38.9 C) or higher.   Your baby is 50 months old or younger with a rectal temperature of 100.4 F (38 C) or higher.  MAKE SURE YOU:   Understand these instructions.   Will watch your condition.   Will get help right away if you are not doing well or get worse.  Document Released: 12/25/2000 Document Revised: 03/21/2011 Document Reviewed: 11/18/2007 [facility] Patient Information 2012 [place].

## 2011-07-31 NOTE — ED Provider Notes (Signed)
History     CSN: [id number]  Arrival date & time 07/30/11  1611   First MD Initiated Contact with Patient 07/30/11 1703      Chief Complaint  Patient presents with  . Rib Injury    (Consider location/radiation/quality/duration/timing/severity/associated sxs/prior treatment) HPI Comments: Patient c/o localized pain to his right lateral ribs for 2 weeks.  States that pain began while he was leaning over a truck working on the motor, and felt a "pop" to his ribs.  Pain is worse with certain movements, deep breathing and coughing.  Improves with rest.  He denies abd pain, hematuria, or dyspnea  Patient is a 66 y.o. male presenting with chest pain. The history is provided by the patient.  Chest Pain The chest pain began 1 - 2 weeks ago. Chest pain occurs constantly. The chest pain is unchanged. The pain is associated with breathing, coughing, exertion and lifting. The severity of the pain is mild. The quality of the pain is described as aching. The pain does not radiate. Chest pain is worsened by certain positions, deep breathing and exertion. Pertinent negatives for primary symptoms include no fever, no fatigue, no syncope, no shortness of breath, no cough, no abdominal pain, no nausea, no vomiting, no dizziness and no altered mental status.  Pertinent negatives for associated symptoms include no lower extremity edema, no numbness and no orthopnea. He tried nothing for the symptoms.     Past Medical History  Diagnosis Date  . Murmur   . Hypertension   . Diabetes mellitus   . Chronic sinusitis   . OSA (obstructive sleep apnea)   . Paroxysmal a-fib   . Neuropathy in diabetes   . COPD (chronic obstructive pulmonary disease)   . Tobacco abuse   . Hard of hearing     Right ear  . Obesity     Past Surgical History  Procedure Date  . Appendectomy   . Hernia repair   . Cataract     right lens replacement  . Sinus exploration     1998    Family History  Problem Relation Age of  Onset  . Adopted: Yes    History  Substance Use Topics  . Smoking status: Current Everyday Smoker -- 1.5 packs/day for 60 years    Types: Cigarettes  . Smokeless tobacco: Not on file  . Alcohol Use: No      Review of Systems  Constitutional: Negative for fever and fatigue.  HENT: Negative for neck pain.   Respiratory: Negative for cough, chest tightness and shortness of breath.   Cardiovascular: Positive for chest pain. Negative for orthopnea and syncope.  Gastrointestinal: Negative for nausea, vomiting and abdominal pain.  Musculoskeletal: Negative.   Skin: Negative.   Neurological: Negative for dizziness and numbness.  Psychiatric/Behavioral: Negative for altered mental status.  All other systems reviewed and are negative.    Allergies  Review of patient's allergies indicates no known allergies.  Home Medications   Current Outpatient Rx  Name Route Sig Dispense Refill  . ALBUTEROL SULFATE HFA 108 (90 BASE) MCG/ACT IN AERS Inhalation Inhale 2 puffs into the lungs every 6 (six) hours as needed.    Marland Kitchen AMLODIPINE BESYLATE 10 MG PO TABS Oral Take 10 mg by mouth daily.    Marland Kitchen FLUTICASONE PROPIONATE 50 MCG/ACT NA SUSP Nasal Place 2 sprays into the nose daily.    . FUROSEMIDE 20 MG PO TABS Oral Take 20 mg by mouth daily.    Marland Kitchen GABAPENTIN 300 MG  PO CAPS Oral Take 300 mg by mouth 2 (two) times daily.    . INSULIN ASPART 100 UNIT/ML Martin SOLN Subcutaneous Inject 10 Units into the skin 3 (three) times daily before meals.    . INSULIN ISOPHANE & REGULAR (70-30) 100 UNIT/ML [place] SUSP Subcutaneous Inject 50 Units into the skin 2 (two) times daily.     Marland Kitchen METOPROLOL TARTRATE 50 MG PO TABS Oral Take 1 tablet (50 mg total) by mouth 2 (two) times daily. 60 tablet 0  . ROSUVASTATIN CALCIUM 20 MG PO TABS Oral Take 10 mg by mouth every evening.    . WARFARIN SODIUM 10 MG PO TABS Oral Take 10 mg by mouth daily. Take one & one-half tablet on Mondays and Fridays. Take one tablet on all other days       BP 134/60  Pulse 76  Temp(Src) 98.1 F (36.7 C) (Oral)  Resp 20  Ht 5\' 6"  (1.676 m)  Wt 265 lb (120.203 kg)  BMI 42.77 kg/m2  SpO2 98%  Physical Exam  Nursing note and vitals reviewed. Constitutional: He is oriented to person, place, and time. He appears well-developed and well-nourished. No distress.  HENT:  Head: Normocephalic and atraumatic.  Neck: Normal range of motion. Neck supple.  Cardiovascular: Normal rate, regular rhythm, normal heart sounds and intact distal pulses.   No murmur heard. Pulmonary/Chest: Effort normal and breath sounds normal. Chest wall is not dull to percussion. He exhibits tenderness. He exhibits no mass, no crepitus, no edema, no deformity, no swelling and no retraction.         Localized ttp of the lateral right ribs.  No edema, bruising or crepitus  Abdominal: Soft. He exhibits no distension. There is no tenderness. There is no rebound and no guarding.  Neurological: He is alert and oriented to person, place, and time. He exhibits normal muscle tone. Coordination normal.  Skin: Skin is warm and dry.    ED Course  Procedures (including critical care time)  Labs Reviewed - No data to display Dg Ribs Unilateral W/chest Right  07/30/2011  *RADIOLOGY REPORT*  Clinical Data: Injury to the right ribs 3 weeks ago.  RIGHT RIBS AND CHEST - 3+ VIEW  Comparison: Plain films of the chest 06/26/2010.  Findings: Lungs are clear.  No pneumothorax or pleural effusion. Heart size is normal.  No rib fracture is present.  IMPRESSION: Negative study.  Original Report Authenticated By: Arvid Right. D'ALESSIO, M.D.     1. Chest wall contusion       MDM    Vitals stable.  Patient ambulates well.  Lungs are CTA bilaterally.  He agrees to f/u with his PMD if needed or return here if needed.   Patient / Family / Caregiver understand and agree with initial ED impression and plan with expectations set for ED visit. Pt stable in ED with no significant deterioration  in condition. Pt feels improved after observation and/or treatment in ED.    Kalisha Keadle L. [place], [place] 07/31/11 1414

## 2011-08-01 NOTE — ED Provider Notes (Signed)
Medical screening examination/treatment/procedure(s) were performed by non-physician practitioner and as supervising physician I was immediately available for consultation/collaboration.   Mylinda Latina III, MD 08/01/11 [phone]

## 2012-06-26 IMAGING — CR DG RIBS W/ CHEST 3+V*R*
5 series · 5 of 5 positions shown · non-contrast
Comparison: Plain films of the chest 06/26/2010.

CLINICAL DATA: Injury to the right ribs 3 weeks ago.

RIGHT RIBS AND CHEST - 3+ VIEW

[view not recorded (1 of 5)]
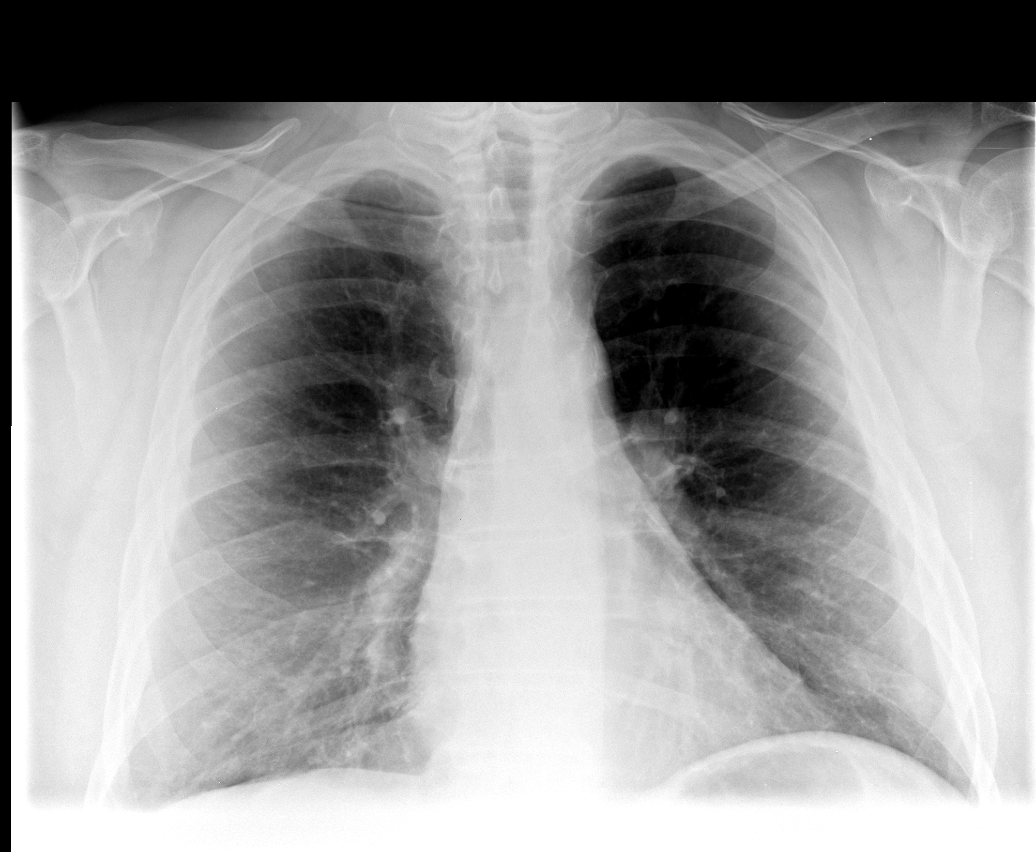

[view not recorded (2 of 5)]
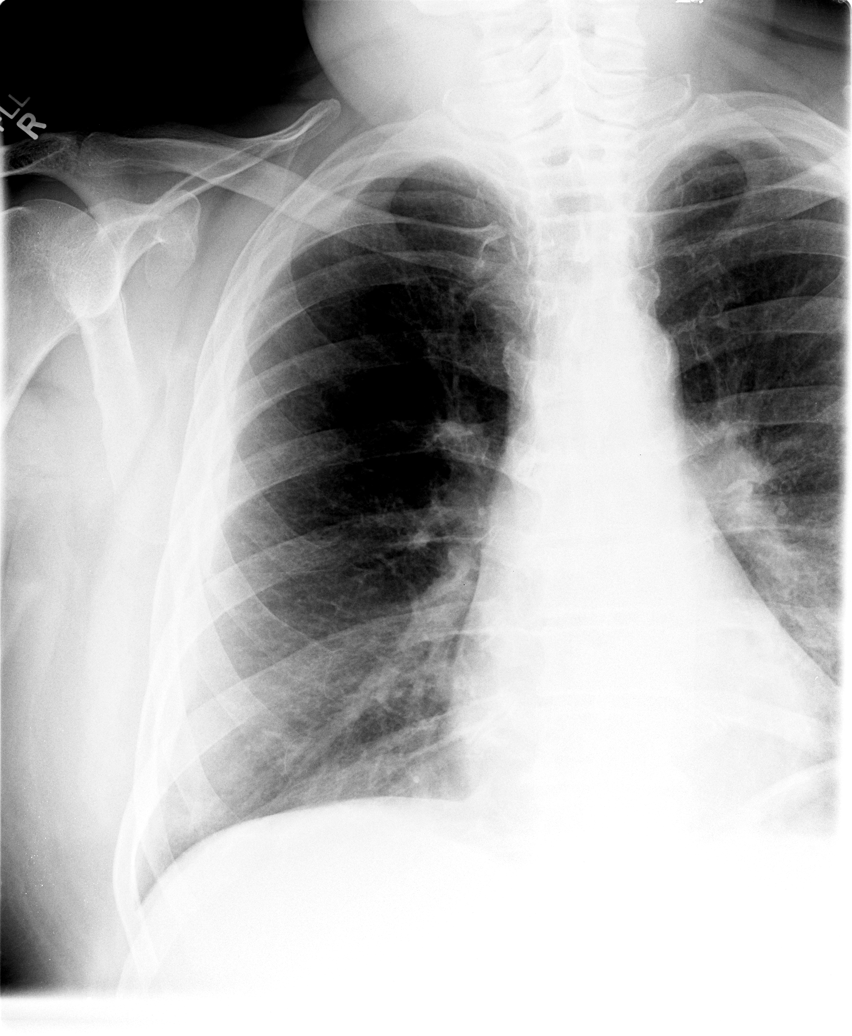

[view not recorded (3 of 5)]
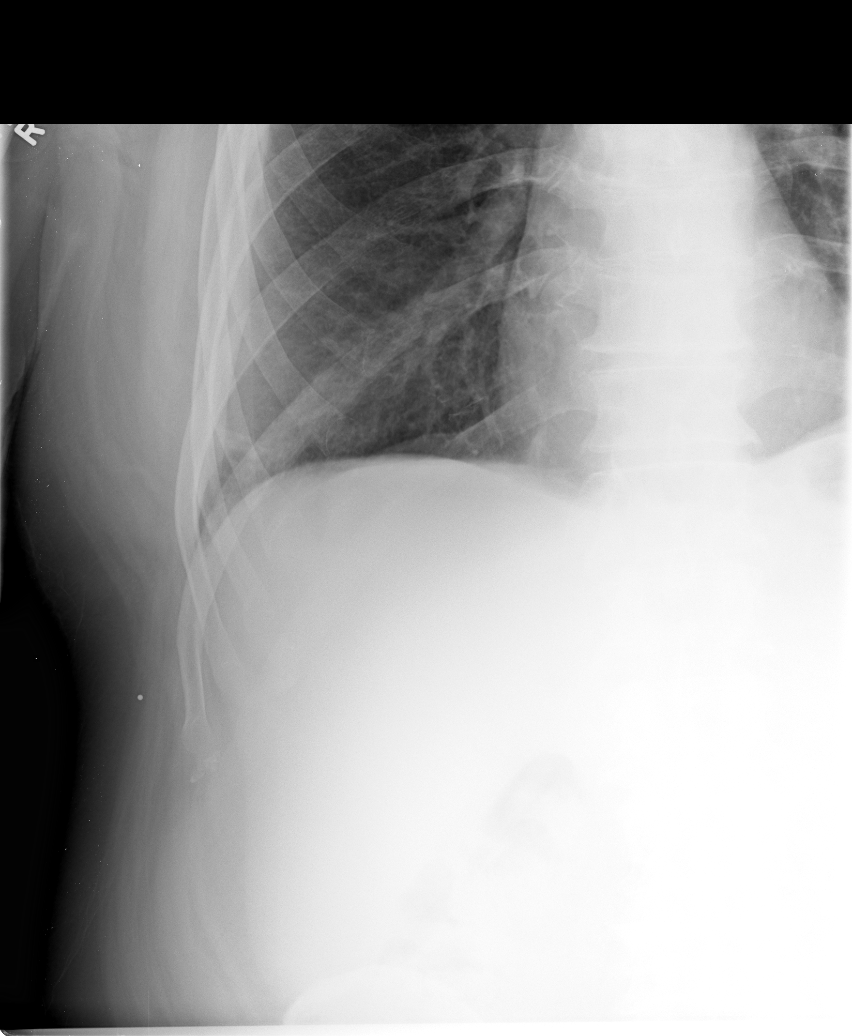

[view not recorded (4 of 5)]
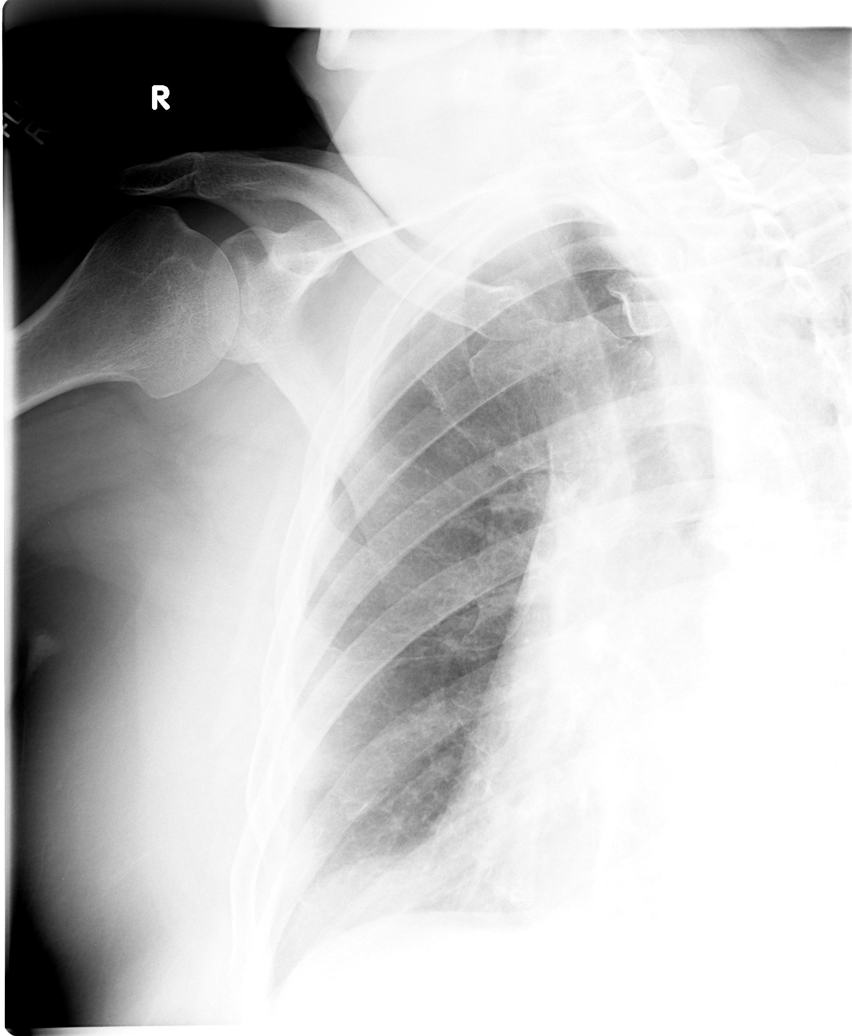

[view not recorded (5 of 5)]
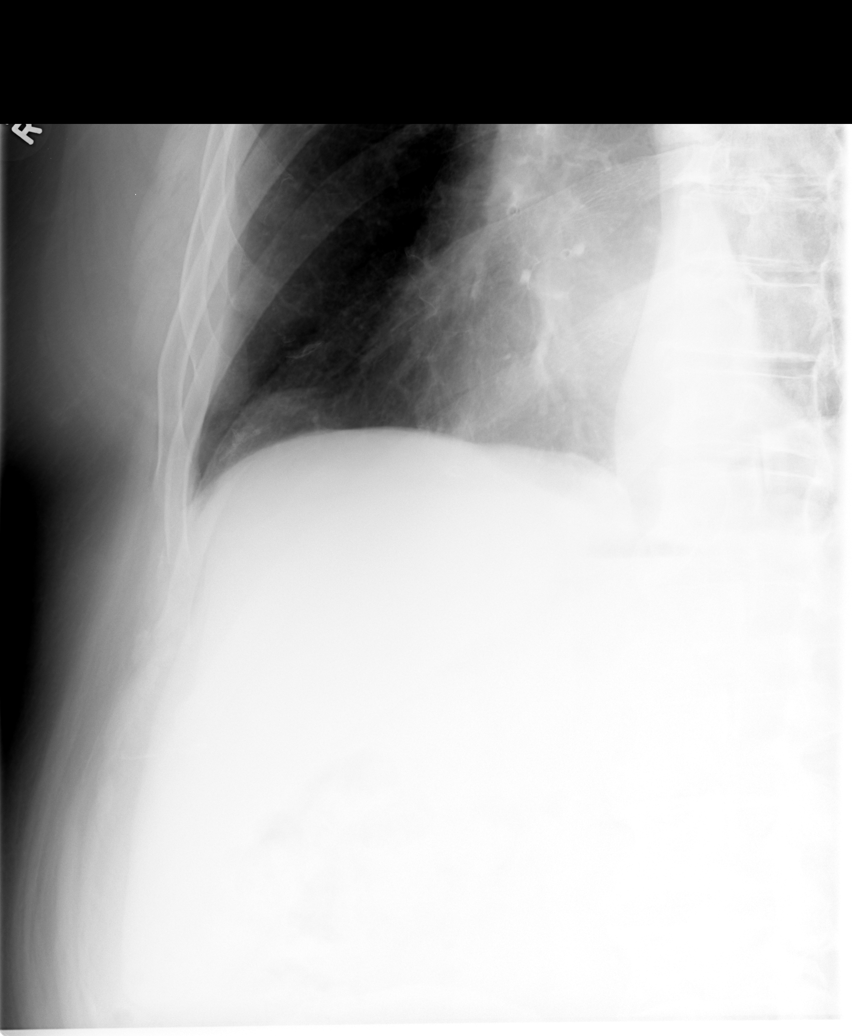

[5 of 5 positions shown; findings below may reference images not displayed]

FINDINGS: Lungs are clear.  No pneumothorax or pleural effusion.
Heart size is normal.  No rib fracture is present.
IMPRESSION: Negative study.

## 2017-11-14 ENCOUNTER — Emergency Department (HOSPITAL_COMMUNITY)
Admission: EM | Admit: 2017-11-14 | Discharge: 2017-11-14 | Disposition: A | Discharge status: Home / Self Care | Payer: Medicare Other | Attending: Emergency Medicine | Admitting: Emergency Medicine

## 2017-11-14 ENCOUNTER — Emergency Department (HOSPITAL_COMMUNITY): Payer: Medicare Other

## 2017-11-14 ENCOUNTER — Encounter (HOSPITAL_COMMUNITY): Payer: Self-pay

## 2017-11-14 DIAGNOSIS — R296 Repeated falls: Secondary | ICD-10-CM | POA: Insufficient documentation

## 2017-11-14 DIAGNOSIS — F17290 Nicotine dependence, other tobacco product, uncomplicated: Secondary | ICD-10-CM | POA: Diagnosis not present

## 2017-11-14 DIAGNOSIS — Z794 Long term (current) use of insulin: Secondary | ICD-10-CM | POA: Diagnosis not present

## 2017-11-14 DIAGNOSIS — M6281 Muscle weakness (generalized): Secondary | ICD-10-CM | POA: Diagnosis not present

## 2017-11-14 DIAGNOSIS — R0602 Shortness of breath: Secondary | ICD-10-CM | POA: Diagnosis present

## 2017-11-14 DIAGNOSIS — J40 Bronchitis, not specified as acute or chronic: Secondary | ICD-10-CM

## 2017-11-14 DIAGNOSIS — Z79899 Other long term (current) drug therapy: Secondary | ICD-10-CM | POA: Insufficient documentation

## 2017-11-14 DIAGNOSIS — J449 Chronic obstructive pulmonary disease, unspecified: Secondary | ICD-10-CM | POA: Insufficient documentation

## 2017-11-14 DIAGNOSIS — I10 Essential (primary) hypertension: Secondary | ICD-10-CM | POA: Insufficient documentation

## 2017-11-14 DIAGNOSIS — E119 Type 2 diabetes mellitus without complications: Secondary | ICD-10-CM | POA: Diagnosis not present

## 2017-11-14 DIAGNOSIS — R531 Weakness: Secondary | ICD-10-CM

## 2017-11-14 DIAGNOSIS — Z7901 Long term (current) use of anticoagulants: Secondary | ICD-10-CM | POA: Insufficient documentation

## 2017-11-14 LAB — CBC WITH DIFFERENTIAL/PLATELET
Basophils Absolute: 0 10*3/uL (ref 0.0–0.1)
Basophils Relative: 0 %
Eosinophils Absolute: 0 10*3/uL (ref 0.0–0.7)
Eosinophils Relative: 0 %
HCT: 45.6 % (ref 39.0–52.0)
Hemoglobin: 16.1 g/dL (ref 13.0–17.0)
Lymphocytes Relative: 12 %
Lymphs Abs: 0.7 10*3/uL (ref 0.7–4.0)
MCH: 32.9 pg (ref 26.0–34.0)
MCHC: 35.3 g/dL (ref 30.0–36.0)
MCV: 93.3 fL (ref 78.0–100.0)
Monocytes Absolute: 0.7 10*3/uL (ref 0.1–1.0)
Monocytes Relative: 12 %
Neutro Abs: 4.2 10*3/uL (ref 1.7–7.7)
Neutrophils Relative %: 76 %
Platelets: 141 10*3/uL — ABNORMAL LOW (ref 150–400)
RBC: 4.89 MIL/uL (ref 4.22–5.81)
RDW: 13.5 % (ref 11.5–15.5)
WBC: 5.6 10*3/uL (ref 4.0–10.5)

## 2017-11-14 LAB — URINALYSIS, ROUTINE W REFLEX MICROSCOPIC
Bacteria, UA: NONE SEEN
Bilirubin Urine: NEGATIVE
Glucose, UA: NEGATIVE mg/dL
Hgb urine dipstick: NEGATIVE
Ketones, ur: NEGATIVE mg/dL
Leukocytes, UA: NEGATIVE
Nitrite: NEGATIVE
Protein, ur: 100 mg/dL — AB
Specific Gravity, Urine: 1.012 (ref 1.005–1.030)
pH: 6 (ref 5.0–8.0)

## 2017-11-14 LAB — BASIC METABOLIC PANEL
Anion gap: 10 (ref 5–15)
BUN: 24 mg/dL — ABNORMAL HIGH (ref 8–23)
CO2: 27 mmol/L (ref 22–32)
Calcium: 9 mg/dL (ref 8.9–10.3)
Chloride: 100 mmol/L (ref 98–111)
Creatinine, Ser: 1.59 mg/dL — ABNORMAL HIGH (ref 0.61–1.24)
GFR calc Af Amer: 48 mL/min — ABNORMAL LOW (ref >60)
GFR calc non Af Amer: 41 mL/min — ABNORMAL LOW (ref >60)
Glucose, Bld: 173 mg/dL — ABNORMAL HIGH (ref 70–99)
Potassium: 4 mmol/L (ref 3.5–5.1)
Sodium: 137 mmol/L (ref 135–145)

## 2017-11-14 LAB — I-STAT CG4 LACTIC ACID, ED: Lactic Acid, Venous: 1.36 mmol/L (ref 0.5–1.9)

## 2017-11-14 MED ORDER — SODIUM CHLORIDE 0.9 % IV BOLUS
500.0000 mL | Freq: Once | INTRAVENOUS | Status: AC
  Administered 2017-11-14: 500 mL via INTRAVENOUS

## 2017-11-14 MED ORDER — ACETAMINOPHEN 325 MG PO TABS
650.0000 mg | ORAL_TABLET | Freq: Once | ORAL | Status: AC
  Administered 2017-11-14: 650 mg via ORAL
  Filled 2017-11-14: qty 2

## 2017-11-14 MED ORDER — PREDNISONE 20 MG PO TABS: 40.0000 mg | ORAL_TABLET | Freq: Every day | ORAL | 0 refills | Status: DC

## 2017-11-14 MED ORDER — CIPROFLOXACIN HCL 250 MG PO TABS
500.0000 mg | ORAL_TABLET | Freq: Once | ORAL | Status: AC
  Administered 2017-11-14: 500 mg via ORAL
  Filled 2017-11-14: qty 2

## 2017-11-14 MED ORDER — LEVOFLOXACIN 500 MG PO TABS: 500.0000 mg | ORAL_TABLET | Freq: Every day | ORAL | 0 refills | Status: DC

## 2017-11-14 NOTE — ED Notes (Signed)
Pt cannot provide urine sample at this time.  

## 2017-11-14 NOTE — ED Provider Notes (Signed)
[facility] EMERGENCY DEPARTMENT Provider Note   CSN: [id number] Arrival date & time: 11/14/17  6314     History   Chief Complaint Chief Complaint  Patient presents with  . Shortness of Breath  . Weakness    HPI Craig Black is a 72 y.o. male.  HPI   72yM with generalized weakness starting 2d ago. Progressively worsening. Since 0100 today has felt very weak to the point of falling. Says he feels like his back has no strength. Increasing productive cough. Subjective fever. Feels SOB. Chronic but feels like it has been a little worse the past few days. Occasional nonproductive cough.   Past Medical History:  Diagnosis Date  . Chronic sinusitis   . COPD (chronic obstructive pulmonary disease) ([place])   . Diabetes mellitus   . Hard of hearing    Right ear  . Hypertension   . Murmur   . Neuropathy in diabetes ([place])   . Obesity   . OSA (obstructive sleep apnea)   . Paroxysmal A-fib ([place])   . Tobacco abuse     Patient Active Problem List   Diagnosis Date Noted  . Diabetes mellitus 06/26/2011  . Acute renal failure ([place]) 06/26/2011  . Hypertension   . Chronic sinusitis   . OSA (obstructive sleep apnea)   . Paroxysmal A-fib ([place])   . Neuropathy in diabetes ([place])   . COPD (chronic obstructive pulmonary disease) ([place])   . Tobacco abuse   . Hard of hearing   . Obesity     Past Surgical History:  Procedure Laterality Date  . APPENDECTOMY    . cataract     right lens replacement  . HERNIA REPAIR    . SINUS EXPLORATION     1998        Home Medications    Prior to Admission medications   Medication Sig Start Date End Date Taking? Authorizing Provider  albuterol (PROVENTIL HFA) 108 (90 BASE) MCG/ACT inhaler Inhale 2 puffs into the lungs every 6 (six) hours as needed.    [provider]  amLODipine (NORVASC) 10 MG tablet Take 10 mg by mouth daily.    [provider]  fluticasone (FLONASE) 50 MCG/ACT nasal spray Place 2 sprays into the nose  daily.    [provider]  furosemide (LASIX) 20 MG tablet Take 20 mg by mouth daily.    [provider]  gabapentin (NEURONTIN) 300 MG capsule Take 300 mg by mouth 2 (two) times daily.    [provider]  insulin aspart (NOVOLOG) 100 UNIT/ML injection Inject 10 Units into the skin 3 (three) times daily before meals.    [provider]  insulin NPH-insulin regular (NOVOLIN 70/30) (70-30) 100 UNIT/ML injection Inject 50 Units into the skin 2 (two) times daily.     [provider]  rosuvastatin (CRESTOR) 20 MG tablet Take 10 mg by mouth every evening.    [provider]  warfarin (COUMADIN) 10 MG tablet Take 10 mg by mouth daily. Take one & one-half tablet on Mondays and Fridays. Take one tablet on all other days    [provider]    Family History Family History  Adopted: Yes    Social History Social History   Tobacco Use  . Smoking status: Current Every Day Smoker    Years: 60.00    Types: Cigars  . Tobacco comment: 4 cigars  Substance Use Topics  . Alcohol use: No  . Drug use: No  Allergies   Patient has no known allergies.   Review of Systems Review of Systems  All systems reviewed and negative, other than as noted in HPI.  Physical Exam Updated Vital Signs BP 134/68   Pulse (!) 103   Temp (!) 101.2 F (38.4 C) (Oral)   Resp 16   Wt 120.2 kg (265 lb)   SpO2 98%   BMI 42.77 kg/m   Physical Exam  Constitutional: He appears well-developed and well-nourished. No distress.  Laying in bed. NAD. Obese.   HENT:  Head: Normocephalic and atraumatic.  Eyes: Conjunctivae are normal. Right eye exhibits no discharge. Left eye exhibits no discharge.  Neck: Neck supple.  Cardiovascular: Normal rate, regular rhythm and normal heart sounds. Exam reveals no gallop and no friction rub.  No murmur heard. Pulmonary/Chest: Effort normal. No respiratory distress. He has wheezes.  Abdominal: Soft. He exhibits no  distension. There is no tenderness.  Musculoskeletal: He exhibits no edema or tenderness.  Neurological: He is alert. No cranial nerve deficit. He exhibits normal muscle tone. Coordination normal.  Skin: Skin is warm and dry.  Psychiatric: He has a normal mood and affect. His behavior is normal. Thought content normal.  Nursing note and vitals reviewed.    ED Treatments / Results  Labs (all labs ordered are listed, but only abnormal results are displayed) Labs Reviewed  BASIC METABOLIC PANEL - Abnormal; Notable for the following components:      Result Value   Glucose, Bld 173 (*)    BUN 24 (*)    Creatinine, Ser 1.59 (*)    GFR calc non Af Amer 41 (*)    GFR calc Af Amer 48 (*)    All other components within normal limits  CBC WITH DIFFERENTIAL/PLATELET - Abnormal; Notable for the following components:   Platelets 141 (*)    All other components within normal limits  URINALYSIS, ROUTINE W REFLEX MICROSCOPIC - Abnormal; Notable for the following components:   Protein, ur 100 (*)    All other components within normal limits  I-STAT CG4 LACTIC ACID, ED    EKG EKG Interpretation  Date/Time:  Friday November 14 2017 07:51:27 EDT Ventricular Rate:  93 PR Interval:    QRS Duration: 108 QT Interval:  369 QTC Calculation: 459 R Axis:   -45 Text Interpretation:  Atrial fibrillation LAD, consider left anterior fascicular block Low voltage, precordial leads Abnormal R-wave progression, early transition Non-specific ST-t changes Confirmed by Virgel Manifold [phone]) on 11/14/2017 8:21:39 AM   Radiology No results found.   Dg Chest 2 View  Result Date: 11/14/2017 CLINICAL DATA:  Shortness of breath.  Cough. EXAM: CHEST - 2 VIEW COMPARISON:  07/30/2011.  06/26/2011. FINDINGS: Mediastinum and hilar structures normal. Cardiomegaly. Bilateral peribronchial cuffing noted. Bronchitis could present and this fashion. Mild increase and bibasilar interstitial prominence noted. Mild pneumonitis  and/or interstitial edema may be present. No pleural effusion or pneumothorax. Biapical pleural thickening again noted consistent scarring. IMPRESSION: 1.  Peribronchial cuffing.  Bronchitis could present this fashion. 2. Mild bibasilar interstitial increased prominence. These changes could be related to interstitial pneumonitis and/or edema. 3.  Cardiomegaly. Electronically Signed   By: Marcello Moores  Register   On: 11/14/2017 08:45   Ct Head Wo Contrast  Result Date: 11/14/2017 CLINICAL DATA:  Multiple falls.  Right arm weakness. EXAM: CT HEAD WITHOUT CONTRAST TECHNIQUE: Contiguous axial images were obtained from the base of the skull through the vertex without intravenous contrast. COMPARISON:  None available currently. FINDINGS: Brain:  Mild chronic ischemic white matter disease is noted. No mass effect or midline shift is noted. Ventricular size is within normal limits. There is no evidence of mass lesion, hemorrhage or acute infarction. Vascular: No hyperdense vessel or unexpected calcification. Skull: Normal. Negative for fracture or focal lesion. Sinuses/Orbits: Mucous retention cyst is noted in right sphenoid sinus. Other: None. IMPRESSION: Mild chronic ischemic white matter disease. No acute intracranial abnormality seen. Electronically Signed   By: Marijo Conception, M.D.   On: 11/14/2017 10:02    Procedures Procedures (including critical care time)  Medications Ordered in ED Medications - No data to display   Initial Impression / Assessment and Plan / ED Course  I have reviewed the triage vital signs and the nursing notes.  Pertinent labs & imaging results that were available during my care of the patient were reviewed by me and considered in my medical decision making (see chart for details).     72yM with generalized weakness. COPD exacerbation/bronchitis? A little more dyspneic and increased cough recently. He is afebrile. No leukocytosis. Seems comfortable at least at rest. WIll place on  steroids for a few days in addition to abx with increased cough although no focal infiltrate noted on imaging.   Final Clinical Impressions(s) / ED Diagnoses   Final diagnoses:  Generalized weakness  Bronchitis    ED Discharge Orders        Ordered    predniSONE (DELTASONE) 20 MG tablet  Daily     11/14/17 1240    levofloxacin (LEVAQUIN) 500 MG tablet  Daily     11/14/17 1240       Virgel Manifold, MD 11/17/17 [phone]

## 2017-11-14 NOTE — ED Triage Notes (Signed)
Pt brought in by EMS due to fall x2, weakness and SOB. Pt fell last night and refused transport. Then fell again this morning. Pt reports weakness and SOB x 3 days. Pt reports to EMS that 3 days ago he had right arm weakness and numbness in right arm that has since resolved.

## 2019-08-23 ENCOUNTER — Other Ambulatory Visit: Payer: Self-pay | Admitting: Internal Medicine

## 2019-08-23 ENCOUNTER — Other Ambulatory Visit (HOSPITAL_COMMUNITY): Payer: Self-pay | Admitting: Internal Medicine

## 2019-08-23 DIAGNOSIS — I4821 Permanent atrial fibrillation: Secondary | ICD-10-CM | POA: Diagnosis not present

## 2019-08-23 DIAGNOSIS — E1165 Type 2 diabetes mellitus with hyperglycemia: Secondary | ICD-10-CM | POA: Diagnosis not present

## 2019-08-23 DIAGNOSIS — I739 Peripheral vascular disease, unspecified: Secondary | ICD-10-CM | POA: Diagnosis not present

## 2019-08-23 DIAGNOSIS — F17210 Nicotine dependence, cigarettes, uncomplicated: Secondary | ICD-10-CM | POA: Diagnosis not present

## 2019-08-23 DIAGNOSIS — M79606 Pain in leg, unspecified: Secondary | ICD-10-CM

## 2019-08-27 ENCOUNTER — Other Ambulatory Visit: Payer: Self-pay

## 2019-08-27 ENCOUNTER — Ambulatory Visit (HOSPITAL_COMMUNITY)
Admission: RE | Admit: 2019-08-27 | Discharge: 2019-08-27 | Disposition: A | Discharge status: Home / Self Care | Payer: Medicare HMO | Source: Ambulatory Visit | Attending: Internal Medicine | Admitting: Internal Medicine

## 2019-08-27 DIAGNOSIS — E1165 Type 2 diabetes mellitus with hyperglycemia: Secondary | ICD-10-CM | POA: Diagnosis not present

## 2019-08-27 DIAGNOSIS — I70213 Atherosclerosis of native arteries of extremities with intermittent claudication, bilateral legs: Secondary | ICD-10-CM | POA: Diagnosis not present

## 2019-08-27 DIAGNOSIS — M79606 Pain in leg, unspecified: Secondary | ICD-10-CM | POA: Insufficient documentation

## 2019-08-27 DIAGNOSIS — Z0001 Encounter for general adult medical examination with abnormal findings: Secondary | ICD-10-CM | POA: Diagnosis not present

## 2019-08-27 DIAGNOSIS — I10 Essential (primary) hypertension: Secondary | ICD-10-CM | POA: Diagnosis not present

## 2019-09-01 ENCOUNTER — Other Ambulatory Visit: Payer: Self-pay | Admitting: Internal Medicine

## 2019-09-01 ENCOUNTER — Other Ambulatory Visit (HOSPITAL_COMMUNITY): Payer: Self-pay | Admitting: Internal Medicine

## 2019-09-01 DIAGNOSIS — N184 Chronic kidney disease, stage 4 (severe): Secondary | ICD-10-CM

## 2019-09-08 ENCOUNTER — Other Ambulatory Visit: Payer: Self-pay

## 2019-09-08 ENCOUNTER — Ambulatory Visit (HOSPITAL_COMMUNITY)
Admission: RE | Admit: 2019-09-08 | Discharge: 2019-09-08 | Disposition: A | Discharge status: Home / Self Care | Payer: Medicare HMO | Source: Ambulatory Visit | Attending: Internal Medicine | Admitting: Internal Medicine

## 2019-09-08 DIAGNOSIS — N200 Calculus of kidney: Secondary | ICD-10-CM | POA: Diagnosis not present

## 2019-09-08 DIAGNOSIS — N184 Chronic kidney disease, stage 4 (severe): Secondary | ICD-10-CM | POA: Insufficient documentation

## 2019-09-14 ENCOUNTER — Other Ambulatory Visit (HOSPITAL_COMMUNITY): Payer: Self-pay | Admitting: Internal Medicine

## 2019-09-14 ENCOUNTER — Ambulatory Visit (HOSPITAL_COMMUNITY): Payer: Medicare HMO

## 2019-09-14 DIAGNOSIS — M79605 Pain in left leg: Secondary | ICD-10-CM

## 2019-09-23 DIAGNOSIS — E1165 Type 2 diabetes mellitus with hyperglycemia: Secondary | ICD-10-CM | POA: Diagnosis not present

## 2019-09-23 DIAGNOSIS — I10 Essential (primary) hypertension: Secondary | ICD-10-CM | POA: Diagnosis not present

## 2019-10-08 DIAGNOSIS — Z79899 Other long term (current) drug therapy: Secondary | ICD-10-CM | POA: Diagnosis not present

## 2019-10-08 DIAGNOSIS — Z716 Tobacco abuse counseling: Secondary | ICD-10-CM | POA: Diagnosis not present

## 2019-10-08 DIAGNOSIS — R809 Proteinuria, unspecified: Secondary | ICD-10-CM | POA: Diagnosis not present

## 2019-10-08 DIAGNOSIS — N189 Chronic kidney disease, unspecified: Secondary | ICD-10-CM | POA: Diagnosis not present

## 2019-10-08 DIAGNOSIS — R600 Localized edema: Secondary | ICD-10-CM | POA: Diagnosis not present

## 2019-10-08 DIAGNOSIS — N400 Enlarged prostate without lower urinary tract symptoms: Secondary | ICD-10-CM | POA: Diagnosis not present

## 2019-10-08 DIAGNOSIS — G4733 Obstructive sleep apnea (adult) (pediatric): Secondary | ICD-10-CM | POA: Diagnosis not present

## 2019-10-08 DIAGNOSIS — I129 Hypertensive chronic kidney disease with stage 1 through stage 4 chronic kidney disease, or unspecified chronic kidney disease: Secondary | ICD-10-CM | POA: Diagnosis not present

## 2019-10-08 DIAGNOSIS — N200 Calculus of kidney: Secondary | ICD-10-CM | POA: Diagnosis not present

## 2019-10-12 DIAGNOSIS — R809 Proteinuria, unspecified: Secondary | ICD-10-CM | POA: Diagnosis not present

## 2019-10-12 DIAGNOSIS — E559 Vitamin D deficiency, unspecified: Secondary | ICD-10-CM | POA: Diagnosis not present

## 2019-10-12 DIAGNOSIS — N400 Enlarged prostate without lower urinary tract symptoms: Secondary | ICD-10-CM | POA: Diagnosis not present

## 2019-10-12 DIAGNOSIS — N200 Calculus of kidney: Secondary | ICD-10-CM | POA: Diagnosis not present

## 2019-10-12 DIAGNOSIS — Z79899 Other long term (current) drug therapy: Secondary | ICD-10-CM | POA: Diagnosis not present

## 2019-10-12 DIAGNOSIS — R600 Localized edema: Secondary | ICD-10-CM | POA: Diagnosis not present

## 2019-10-12 DIAGNOSIS — I129 Hypertensive chronic kidney disease with stage 1 through stage 4 chronic kidney disease, or unspecified chronic kidney disease: Secondary | ICD-10-CM | POA: Diagnosis not present

## 2019-10-12 DIAGNOSIS — E1129 Type 2 diabetes mellitus with other diabetic kidney complication: Secondary | ICD-10-CM | POA: Diagnosis not present

## 2019-10-12 DIAGNOSIS — E1122 Type 2 diabetes mellitus with diabetic chronic kidney disease: Secondary | ICD-10-CM | POA: Diagnosis not present

## 2019-10-12 DIAGNOSIS — N189 Chronic kidney disease, unspecified: Secondary | ICD-10-CM | POA: Diagnosis not present

## 2019-10-23 DIAGNOSIS — E1165 Type 2 diabetes mellitus with hyperglycemia: Secondary | ICD-10-CM | POA: Diagnosis not present

## 2019-10-23 DIAGNOSIS — I10 Essential (primary) hypertension: Secondary | ICD-10-CM | POA: Diagnosis not present

## 2019-10-28 ENCOUNTER — Other Ambulatory Visit (HOSPITAL_COMMUNITY): Payer: Self-pay | Admitting: Nephrology

## 2019-10-28 DIAGNOSIS — I129 Hypertensive chronic kidney disease with stage 1 through stage 4 chronic kidney disease, or unspecified chronic kidney disease: Secondary | ICD-10-CM

## 2019-10-28 DIAGNOSIS — R609 Edema, unspecified: Secondary | ICD-10-CM

## 2019-10-29 ENCOUNTER — Other Ambulatory Visit: Payer: Self-pay

## 2019-10-29 ENCOUNTER — Ambulatory Visit (HOSPITAL_COMMUNITY)
Admission: RE | Admit: 2019-10-29 | Discharge: 2019-10-29 | Disposition: A | Discharge status: Home / Self Care | Payer: Medicare HMO | Source: Ambulatory Visit | Attending: Nephrology | Admitting: Nephrology

## 2019-10-29 DIAGNOSIS — R609 Edema, unspecified: Secondary | ICD-10-CM | POA: Diagnosis not present

## 2019-10-29 DIAGNOSIS — I129 Hypertensive chronic kidney disease with stage 1 through stage 4 chronic kidney disease, or unspecified chronic kidney disease: Secondary | ICD-10-CM | POA: Diagnosis not present

## 2019-10-29 LAB — ECHOCARDIOGRAM COMPLETE
Area-P 1/2: 4.15 cm2
S' Lateral: 3.1 cm

## 2019-10-29 NOTE — Progress Notes (Signed)
*  PRELIMINARY RESULTS* Echocardiogram 2D Echocardiogram has been performed.  Craig Black 10/29/2019, 3:44 PM

## 2019-11-09 ENCOUNTER — Encounter: Payer: Medicare HMO | Admitting: Vascular Surgery

## 2019-11-11 ENCOUNTER — Other Ambulatory Visit (HOSPITAL_COMMUNITY): Payer: Self-pay | Admitting: Nephrology

## 2019-11-11 ENCOUNTER — Other Ambulatory Visit: Payer: Self-pay | Admitting: Nephrology

## 2019-11-11 DIAGNOSIS — E1122 Type 2 diabetes mellitus with diabetic chronic kidney disease: Secondary | ICD-10-CM | POA: Diagnosis not present

## 2019-11-11 DIAGNOSIS — I129 Hypertensive chronic kidney disease with stage 1 through stage 4 chronic kidney disease, or unspecified chronic kidney disease: Secondary | ICD-10-CM

## 2019-11-11 DIAGNOSIS — N170 Acute kidney failure with tubular necrosis: Secondary | ICD-10-CM

## 2019-11-11 DIAGNOSIS — E1129 Type 2 diabetes mellitus with other diabetic kidney complication: Secondary | ICD-10-CM | POA: Diagnosis not present

## 2019-11-11 DIAGNOSIS — I517 Cardiomegaly: Secondary | ICD-10-CM | POA: Diagnosis not present

## 2019-11-11 DIAGNOSIS — N189 Chronic kidney disease, unspecified: Secondary | ICD-10-CM | POA: Diagnosis not present

## 2019-11-11 DIAGNOSIS — E211 Secondary hyperparathyroidism, not elsewhere classified: Secondary | ICD-10-CM | POA: Diagnosis not present

## 2019-11-11 DIAGNOSIS — R809 Proteinuria, unspecified: Secondary | ICD-10-CM

## 2019-11-17 ENCOUNTER — Other Ambulatory Visit: Payer: Self-pay

## 2019-11-17 ENCOUNTER — Encounter: Payer: Self-pay | Admitting: Vascular Surgery

## 2019-11-17 ENCOUNTER — Ambulatory Visit (INDEPENDENT_AMBULATORY_CARE_PROVIDER_SITE_OTHER): Payer: Medicare HMO | Admitting: Vascular Surgery

## 2019-11-17 VITALS — BP 138/78 | HR 70 | Temp 97.6°F | Resp 20 | Ht 66.0 in | Wt 275.0 lb

## 2019-11-17 DIAGNOSIS — I739 Peripheral vascular disease, unspecified: Secondary | ICD-10-CM | POA: Diagnosis not present

## 2019-11-17 NOTE — Progress Notes (Signed)
REASON FOR CONSULT:    Peripheral vascular disease.  The consult is requested by Dr. Legrand Rams.  ASSESSMENT & PLAN:   PERIPHERAL VASCULAR DISEASE: This patient has evidence of infrainguinal arterial occlusive disease bilaterally.  He has stable claudication.  He has no rest pain or nonhealing ulcers to suggest critical limb ischemia.  He has fairly brisk Doppler signals in both feet.  I have discussed with him the importance of tobacco cessation (3 min).  We have also discussed the importance of nutrition and I have recommended largely a plant-based diet.  In addition we discussed the importance of getting on a structured walking program to help develop collaterals.  If his symptoms progress then certainly we could consider arteriography.  Currently has a fungal rash in his groins and have instructed him to work on this as if he did need arteriography we would need this cleaned up first.  He does also have some hyperpigmentation to suggest an underlying venous disease.  We discussed the importance of leg elevation.  I have ordered follow-up ABIs in 6 months and I will see him back at that time.  He knows to call sooner if he has problems.   Deitra Mayo, MD Office: [phone]   HPI:   Craig Black is a pleasant 74 y.o. male, who was referred with peripheral vascular disease.  He states that he has had pain in both calves associated with walking for the last year.  The symptoms have gradually progressed over the last year.  He experiences calf pain which is brought on by ambulation and relieved with rest.  This occurs at 15 to 20 feet.  His symptoms are equal on both sides.  There were no other aggravating or alleviating factors.  He denies any history of rest pain or nonhealing ulcers.  His risk factors for peripheral vascular disease include diabetes, hypertension, hypercholesterolemia, and tobacco use.  He denies any family history of premature cardiovascular disease.  He is on Eliquis  for an irregular heart rhythm.  He does not have any more details than that.  He is on a statin.  He is not on aspirin because he is on Eliquis.  The referring documents to say that he has a history of essential hypertension, morbid obesity, atrial fibrillation, type 2 diabetes.  Past Medical History:  Diagnosis Date  . Chronic kidney disease   . Chronic sinusitis   . COPD (chronic obstructive pulmonary disease) ([place])   . Diabetes mellitus   . Hard of hearing    Right ear  . Hypertension   . Murmur   . Neuropathy in diabetes ([place])   . Obesity   . OSA (obstructive sleep apnea)   . Paroxysmal A-fib ([place])   . Tobacco abuse     Family History  Adopted: Yes    SOCIAL HISTORY: Social History   Socioeconomic History  . Marital status: Widowed    Spouse name: Not on file  . Number of children: Not on file  . Years of education: Not on file  . Highest education level: Not on file  Occupational History  . Not on file  Tobacco Use  . Smoking status: Current Every Day Smoker    Years: 60.00    Types: Cigars  . Smokeless tobacco: Never Used  . Tobacco comment: 4 cigars  Vaping Use  . Vaping Use: Never used  Substance and Sexual Activity  . Alcohol use: No  . Drug use: No  . Sexual activity: Not  on file  Other Topics Concern  . Not on file  Social History Narrative  . Not on file   Social Determinants of Health   Financial Resource Strain:   . Difficulty of Paying Living Expenses:   Food Insecurity:   . Worried About Charity fundraiser in the Last Year:   . Arboriculturist in the Last Year:   Transportation Needs:   . Film/video editor (Medical):   Marland Kitchen Lack of Transportation (Non-Medical):   Physical Activity:   . Days of Exercise per Week:   . Minutes of Exercise per Session:   Stress:   . Feeling of Stress :   Social Connections:   . Frequency of Communication with Friends and Family:   . Frequency of Social Gatherings with Friends and Family:   .  Attends Religious Services:   . Active Member of Clubs or Organizations:   . Attends Archivist Meetings:   Marland Kitchen Marital Status:   Intimate Partner Violence:   . Fear of Current or Ex-Partner:   . Emotionally Abused:   Marland Kitchen Physically Abused:   . Sexually Abused:     No Known Allergies  Current Outpatient Medications  Medication Sig Dispense Refill  . albuterol (PROVENTIL HFA) 108 (90 BASE) MCG/ACT inhaler Inhale 2 puffs into the lungs every 6 (six) hours as needed.    Marland Kitchen amLODipine (NORVASC) 10 MG tablet Take 10 mg by mouth daily.    Marland Kitchen apixaban (ELIQUIS) 5 MG TABS tablet Take 5 mg by mouth 2 (two) times daily.    . carvedilol (COREG) 3.125 MG tablet     . cholecalciferol (VITAMIN D) 1000 units tablet Take 6,000 Units by mouth daily.    . finasteride (PROSCAR) 5 MG tablet Take by mouth.    . fluticasone (FLONASE) 50 MCG/ACT nasal spray Place 2 sprays into the nose daily.    . furosemide (LASIX) 20 MG tablet Take by mouth.    . insulin aspart (NOVOLOG) 100 UNIT/ML injection Inject 10 Units into the skin 3 (three) times daily before meals.    . insulin NPH-insulin regular (NOVOLIN 70/30) (70-30) 100 UNIT/ML injection Inject 50 Units into the skin 2 (two) times daily.     . predniSONE (DELTASONE) 20 MG tablet Take 2 tablets (40 mg total) by mouth daily. 10 tablet 0  . rosuvastatin (CRESTOR) 40 MG tablet Take by mouth.     No current facility-administered medications for this visit.    REVIEW OF SYSTEMS:  [X]  denotes positive finding, [ ]  denotes negative finding Cardiac  Comments:  Chest pain or chest pressure:    Shortness of breath upon exertion: x   Short of breath when lying flat:    Irregular heart rhythm: x       Vascular    Pain in calf, thigh, or hip brought on by ambulation: x   Pain in feet at night that wakes you up from your sleep:     Blood clot in your veins:    Leg swelling:  x       Pulmonary    Oxygen at home:    Productive cough:  x   Wheezing:  x        Neurologic    Sudden weakness in arms or legs:     Sudden numbness in arms or legs:     Sudden onset of difficulty speaking or slurred speech:    Temporary loss of vision in one eye:  Problems with dizziness:         Gastrointestinal    Blood in stool:     Vomited blood:         Genitourinary    Burning when urinating:     Blood in urine:        Psychiatric    Major depression:         Hematologic    Bleeding problems:    Problems with blood clotting too easily:        Skin    Rashes or ulcers:        Constitutional    Fever or chills:     PHYSICAL EXAM:   Vitals:   11/17/19 0834  BP: 138/78  Pulse: 70  Resp: 20  Temp: 97.6 F (36.4 C)  SpO2: 91%  Weight: 124.7 kg  Height: 5\' 6"  (1.676 m)   Body mass index is 44.39 kg/m.  GENERAL: The patient is a well-nourished male, in no acute distress. The vital signs are documented above. CARDIAC: There is a regular rate and rhythm.  VASCULAR: I do not detect carotid bruits. He has palpable femoral pulses.  (He does have a fungal rash in both groins) He has a brisk dorsalis pedis and posterior tibial signal bilaterally with the Doppler. He has some slight hyperpigmentation consistent with chronic venous insufficiency. PULMONARY: There is good air exchange bilaterally without wheezing or rales. ABDOMEN: Soft and non-tender with normal pitched bowel sounds.  He has a large abdomen and therefore it is difficult to assess for aneurysm. MUSCULOSKELETAL: There are no major deformities or cyanosis. NEUROLOGIC: No focal weakness or paresthesias are detected. SKIN: There are no ulcers or rashes noted. PSYCHIATRIC: The patient has a normal affect.  DATA:    ABI's: He reportedly had ABIs done at any pen on 08/27/2019.  I have been unable to locate those results.  I have checked into care everywhere.  They were not in the documents sent from the referring office.

## 2019-11-18 ENCOUNTER — Other Ambulatory Visit: Payer: Self-pay | Admitting: *Deleted

## 2019-11-18 DIAGNOSIS — I739 Peripheral vascular disease, unspecified: Secondary | ICD-10-CM

## 2019-11-22 ENCOUNTER — Ambulatory Visit (HOSPITAL_COMMUNITY): Payer: Medicare HMO

## 2019-11-22 ENCOUNTER — Encounter (HOSPITAL_COMMUNITY): Payer: Self-pay

## 2019-11-23 DIAGNOSIS — E1165 Type 2 diabetes mellitus with hyperglycemia: Secondary | ICD-10-CM | POA: Diagnosis not present

## 2019-11-23 DIAGNOSIS — I10 Essential (primary) hypertension: Secondary | ICD-10-CM | POA: Diagnosis not present

## 2019-11-26 DIAGNOSIS — E211 Secondary hyperparathyroidism, not elsewhere classified: Secondary | ICD-10-CM | POA: Diagnosis not present

## 2019-11-26 DIAGNOSIS — N170 Acute kidney failure with tubular necrosis: Secondary | ICD-10-CM | POA: Diagnosis not present

## 2019-11-26 DIAGNOSIS — R809 Proteinuria, unspecified: Secondary | ICD-10-CM | POA: Diagnosis not present

## 2019-11-26 DIAGNOSIS — E1122 Type 2 diabetes mellitus with diabetic chronic kidney disease: Secondary | ICD-10-CM | POA: Diagnosis not present

## 2019-11-26 DIAGNOSIS — N189 Chronic kidney disease, unspecified: Secondary | ICD-10-CM | POA: Diagnosis not present

## 2019-11-26 DIAGNOSIS — E1129 Type 2 diabetes mellitus with other diabetic kidney complication: Secondary | ICD-10-CM | POA: Diagnosis not present

## 2019-11-26 DIAGNOSIS — I129 Hypertensive chronic kidney disease with stage 1 through stage 4 chronic kidney disease, or unspecified chronic kidney disease: Secondary | ICD-10-CM | POA: Diagnosis not present

## 2019-11-29 ENCOUNTER — Ambulatory Visit (HOSPITAL_COMMUNITY)
Admission: RE | Admit: 2019-11-29 | Discharge: 2019-11-29 | Disposition: A | Discharge status: Home / Self Care | Payer: Medicare HMO | Source: Ambulatory Visit | Attending: Nephrology | Admitting: Nephrology

## 2019-11-29 ENCOUNTER — Other Ambulatory Visit: Payer: Self-pay

## 2019-11-29 DIAGNOSIS — N189 Chronic kidney disease, unspecified: Secondary | ICD-10-CM | POA: Diagnosis not present

## 2019-11-29 DIAGNOSIS — R809 Proteinuria, unspecified: Secondary | ICD-10-CM | POA: Diagnosis not present

## 2019-11-29 DIAGNOSIS — N170 Acute kidney failure with tubular necrosis: Secondary | ICD-10-CM | POA: Diagnosis not present

## 2019-11-29 DIAGNOSIS — I129 Hypertensive chronic kidney disease with stage 1 through stage 4 chronic kidney disease, or unspecified chronic kidney disease: Secondary | ICD-10-CM

## 2019-11-29 DIAGNOSIS — E1122 Type 2 diabetes mellitus with diabetic chronic kidney disease: Secondary | ICD-10-CM | POA: Diagnosis not present

## 2019-11-29 DIAGNOSIS — N200 Calculus of kidney: Secondary | ICD-10-CM | POA: Diagnosis not present

## 2019-12-01 DIAGNOSIS — I129 Hypertensive chronic kidney disease with stage 1 through stage 4 chronic kidney disease, or unspecified chronic kidney disease: Secondary | ICD-10-CM | POA: Diagnosis not present

## 2019-12-01 DIAGNOSIS — E1122 Type 2 diabetes mellitus with diabetic chronic kidney disease: Secondary | ICD-10-CM | POA: Diagnosis not present

## 2019-12-01 DIAGNOSIS — N189 Chronic kidney disease, unspecified: Secondary | ICD-10-CM | POA: Diagnosis not present

## 2019-12-01 DIAGNOSIS — N170 Acute kidney failure with tubular necrosis: Secondary | ICD-10-CM | POA: Diagnosis not present

## 2019-12-01 DIAGNOSIS — E1129 Type 2 diabetes mellitus with other diabetic kidney complication: Secondary | ICD-10-CM | POA: Diagnosis not present

## 2019-12-01 DIAGNOSIS — R809 Proteinuria, unspecified: Secondary | ICD-10-CM | POA: Diagnosis not present

## 2019-12-01 DIAGNOSIS — N200 Calculus of kidney: Secondary | ICD-10-CM | POA: Diagnosis not present

## 2019-12-01 DIAGNOSIS — R310 Gross hematuria: Secondary | ICD-10-CM | POA: Diagnosis not present

## 2019-12-08 ENCOUNTER — Other Ambulatory Visit: Payer: Self-pay

## 2019-12-08 ENCOUNTER — Ambulatory Visit (HOSPITAL_COMMUNITY)
Admission: RE | Admit: 2019-12-08 | Discharge: 2019-12-08 | Disposition: A | Discharge status: Home / Self Care | Payer: Medicare HMO | Source: Ambulatory Visit | Attending: Urology | Admitting: Urology

## 2019-12-08 ENCOUNTER — Ambulatory Visit (INDEPENDENT_AMBULATORY_CARE_PROVIDER_SITE_OTHER): Payer: Medicare HMO | Admitting: Urology

## 2019-12-08 ENCOUNTER — Encounter: Payer: Self-pay | Admitting: Urology

## 2019-12-08 VITALS — BP 170/74 | HR 90 | Temp 98.7°F | Ht 66.0 in | Wt 277.0 lb

## 2019-12-08 DIAGNOSIS — N200 Calculus of kidney: Secondary | ICD-10-CM | POA: Insufficient documentation

## 2019-12-08 DIAGNOSIS — M47816 Spondylosis without myelopathy or radiculopathy, lumbar region: Secondary | ICD-10-CM | POA: Diagnosis not present

## 2019-12-08 DIAGNOSIS — N2889 Other specified disorders of kidney and ureter: Secondary | ICD-10-CM | POA: Diagnosis not present

## 2019-12-08 NOTE — Progress Notes (Signed)
12/08/2019 3:43 PM   IZICK GASBARRO 1945-06-14 [id number]  Referring provider: Rosita Fire, MD [place],  [place] [address]  Nephrolithiasis  HPI: Mr Craig Black is a 74yo here for evaluation of nephrolithiasis.  The patient underwent renal US and 09/08/2019 and 11/30/2019 which showed left renal calculi. His last stone event was when he was 74 years of age.  He was seen by [facility] for CKD. Creatinine 1.59. He underwent renal US in May 2021 and Aug 2021 which showed left renal calculi. He passed a stone 2 weeks ago. He is on eliquis for Afib  PMH: Past Medical History:  Diagnosis Date  . Arthritis   . Chronic kidney disease   . Chronic sinusitis   . COPD (chronic obstructive pulmonary disease) ([place])   . Diabetes mellitus   . Gout   . Hard of hearing    Right ear  . Hyperchloremia   . Hypertension   . Murmur   . Neuropathy in diabetes ([place])   . Obesity   . OSA (obstructive sleep apnea)   . Paroxysmal A-fib ([place])   . Tobacco abuse     Surgical History: Past Surgical History:  Procedure Laterality Date  . APPENDECTOMY    . cataract     right lens replacement  . HERNIA REPAIR    . SINUS EXPLORATION     1998    Home Medications:  Allergies as of 12/08/2019   No Known Allergies     Medication List       Accurate as of December 08, 2019  3:43 PM. If you have any questions, ask your nurse or doctor.        amLODipine 10 MG tablet Commonly known as: NORVASC Take 10 mg by mouth daily.   carvedilol 3.125 MG tablet Commonly known as: COREG   cholecalciferol 1000 units tablet Commonly known as: VITAMIN D Take 6,000 Units by mouth daily.   Eliquis 5 MG Tabs tablet Generic drug: apixaban Take 5 mg by mouth 2 (two) times daily.   finasteride 5 MG tablet Commonly known as: PROSCAR Take by mouth.   fluticasone 50 MCG/ACT nasal spray Commonly known as: FLONASE Place 2 sprays into the nose daily.   furosemide 20 MG  tablet Commonly known as: LASIX Take by mouth.   insulin aspart 100 UNIT/ML injection Commonly known as: novoLOG Inject 10 Units into the skin 3 (three) times daily before meals.   insulin NPH-regular Human (70-30) 100 UNIT/ML injection Inject 50 Units into the skin 2 (two) times daily.   predniSONE 20 MG tablet Commonly known as: DELTASONE Take 2 tablets (40 mg total) by mouth daily.   Proventil HFA 108 (90 Base) MCG/ACT inhaler Generic drug: albuterol Inhale 2 puffs into the lungs every 6 (six) hours as needed.   rosuvastatin 40 MG tablet Commonly known as: CRESTOR Take by mouth.       Allergies: No Known Allergies  Family History: Family History  Adopted: Yes    Social History:  reports that he has been smoking cigars. He has smoked for the past 60.00 years. He has never used smokeless tobacco. He reports that he does not drink alcohol and does not use drugs.  ROS: All other review of systems were reviewed and are negative except what is noted above in HPI  Physical Exam: BP (!) 170/74   Pulse 90   Temp 98.7 F (37.1 C)   Ht 5\' 6"  (1.676 m)   Wt  277 lb (125.6 kg)   BMI 44.71 kg/m   Constitutional:  Alert and oriented, No acute distress. HEENT: [place] AT, moist mucus membranes.  Trachea midline, no masses. Cardiovascular: No clubbing, cyanosis, or edema. Respiratory: Normal respiratory effort, no increased work of breathing. GI: Abdomen is soft, nontender, nondistended, no abdominal masses GU: No CVA tenderness.  Lymph: No cervical or inguinal lymphadenopathy. Skin: No rashes, bruises or suspicious lesions. Neurologic: Grossly intact, no focal deficits, moving all 4 extremities. Psychiatric: Normal mood and affect.  Laboratory Data: Lab Results  Component Value Date   WBC 5.6 11/14/2017   HGB 16.1 11/14/2017   HCT 45.6 11/14/2017   MCV 93.3 11/14/2017   PLT 141 (L) 11/14/2017    Lab Results  Component Value Date   CREATININE 1.59 (H) 11/14/2017     No results found for: PSA  No results found for: TESTOSTERONE  Lab Results  Component Value Date   HGBA1C 6.4 (H) 06/26/2011    Urinalysis    Component Value Date/Time   COLORURINE YELLOW 11/14/2017 [place] 11/14/2017 1152   LABSPEC 1.012 11/14/2017 1152   PHURINE 6.0 11/14/2017 1152   GLUCOSEU NEGATIVE 11/14/2017 1152   HGBUR NEGATIVE 11/14/2017 1152   BILIRUBINUR NEGATIVE 11/14/2017 1152   KETONESUR NEGATIVE 11/14/2017 1152   PROTEINUR 100 (A) 11/14/2017 1152   NITRITE NEGATIVE 11/14/2017 1152   LEUKOCYTESUR NEGATIVE 11/14/2017 1152    Lab Results  Component Value Date   BACTERIA NONE SEEN 11/14/2017    Pertinent Imaging: Renal US 09/08/2019 and 11/29/2019: Images reviewed and disucssed the patient No results found for this or any previous visit.  No results found for this or any previous visit.  No results found for this or any previous visit.  No results found for this or any previous visit.  Results for orders placed during the hospital encounter of 11/29/19  US RENAL  Narrative CLINICAL DATA:  Chronic renal disease with history of acute tubular necrosis  EXAM: RENAL / URINARY TRACT ULTRASOUND COMPLETE  COMPARISON:  Sep 08, 2019  FINDINGS: Right Kidney:  Renal measurements: 12.4 x 6.0 x 6.4 cm = volume: 251.4 mL . Echogenicity and renal cortical thickness are within normal limits. No mass, perinephric fluid, or hydronephrosis visualized. There is a calculus in the mid right kidney measuring 9 mm in length. No evident ureterectasis.  Left Kidney:  Renal measurements: 12.4 x 5.8 x 5.4 cm = volume: 203.5 mL. Echogenicity and renal cortical thickness are within normal limits. No mass, perinephric fluid, or hydronephrosis visualized. There is a calculus in the mid left kidney measuring 4 mm. A second 4 mm calculus is noted more superiorly in the left kidney. No ureterectasis.  Bladder:  Appears normal for degree of bladder  distention.  Other:  None.  IMPRESSION: Nonobstructing calculi in each kidney. Study otherwise unremarkable.   Electronically Signed By: Lowella Grip III M.D. On: 11/30/2019 08:07  No results found for this or any previous visit.  No results found for this or any previous visit.  No results found for this or any previous visit.   Assessment & Plan:    1. Nephrolithiasis -KUB today, will call with results. -We discussed the management of kidney stones. These options include observation, ureteroscopy, shockwave lithotripsy (ESWL) and percutaneous nephrolithotomy (PCNL). We discussed which options are relevant to the patient's stone(s). We discussed the natural history of kidney stones as well as the complications of untreated stones and the impact on quality of life  without treatment as well as with each of the above listed treatments. We also discussed the efficacy of each treatment in its ability to clear the stone burden. With any of these management options I discussed the signs and symptoms of infection and the need for emergent treatment should these be experienced. For each option we discussed the ability of each procedure to clear the patient of their stone burden.   For observation I described the risks which include but are not limited to silent renal damage, life-threatening infection, need for emergent surgery, failure to pass stone and pain.   For ureteroscopy I described the risks which include bleeding, infection, damage to contiguous structures, positioning injury, ureteral stricture, ureteral avulsion, ureteral injury, need for prolonged ureteral stent, inability to perform ureteroscopy, need for an interval procedure, inability to clear stone burden, stent discomfort/pain, heart attack, stroke, pulmonary embolus and the inherent risks with general anesthesia.   For shockwave lithotripsy I described the risks which include arrhythmia, kidney contusion, kidney  hemorrhage, need for transfusion, pain, inability to adequately break up stone, inability to pass stone fragments, Steinstrasse, infection associated with obstructing stones, need for alternate surgical procedure, need for repeat shockwave lithotripsy, MI, CVA, PE and the inherent risks with anesthesia/conscious sedation.   For PCNL I described the risks including positioning injury, pneumothorax, hydrothorax, need for chest tube, inability to clear stone burden, renal laceration, arterial venous fistula or malformation, need for embolization of kidney, loss of kidney or renal function, need for repeat procedure, need for prolonged nephrostomy tube, ureteral avulsion, MI, CVA, PE and the inherent risks of general anesthesia.   - The patient would like to proceed with ESWL pending KUB   No follow-ups on file.  Nicolette Bang, MD  [facility] [place]

## 2019-12-08 NOTE — Patient Instructions (Signed)

## 2019-12-08 NOTE — Progress Notes (Signed)
Urological Symptom Review  Patient is experiencing the following symptoms: Frequent urination Hard to postpone urination Get up at night to urinate Leakage of urine Stream starts and stops Kidney stone   Review of Systems  Gastrointestinal (upper)  : Negative for upper GI symptoms  Gastrointestinal (lower) : Constipation  Constitutional : Fatigue  Skin: Negative for skin symptoms  Eyes: Negative for eye symptoms  Ear/Nose/Throat : Sinus problems  Hematologic/Lymphatic: Negative for Hematologic/Lymphatic symptoms  Cardiovascular : Leg swelling  Respiratory : Cough Shortness of breath  Endocrine: Excessive thirst  Musculoskeletal: Negative for musculoskeletal symptoms  Neurological: Negative for neurological symptoms  Psychologic: Negative for psychiatric symptoms

## 2019-12-08 NOTE — H&P (View-Only) (Signed)
12/08/2019 3:43 PM   Craig Black October 02, 1945 [id number]  Referring provider: Rosita Fire, MD [place],  [place] [address]  Nephrolithiasis  HPI: Craig Black is a 74yo here for evaluation of nephrolithiasis.  The patient underwent renal US and 09/08/2019 and 11/30/2019 which showed left renal calculi. His last stone event was when he was 74 years of age.  He was seen by [facility] for CKD. Creatinine 1.59. He underwent renal US in May 2021 and Aug 2021 which showed left renal calculi. He passed a stone 2 weeks ago. He is on eliquis for Afib  PMH: Past Medical History:  Diagnosis Date  . Arthritis   . Chronic kidney disease   . Chronic sinusitis   . COPD (chronic obstructive pulmonary disease) ([place])   . Diabetes mellitus   . Gout   . Hard of hearing    Right ear  . Hyperchloremia   . Hypertension   . Murmur   . Neuropathy in diabetes ([place])   . Obesity   . OSA (obstructive sleep apnea)   . Paroxysmal A-fib ([place])   . Tobacco abuse     Surgical History: Past Surgical History:  Procedure Laterality Date  . APPENDECTOMY    . cataract     right lens replacement  . HERNIA REPAIR    . SINUS EXPLORATION     1998    Home Medications:  Allergies as of 12/08/2019   No Known Allergies     Medication List       Accurate as of December 08, 2019  3:43 PM. If you have any questions, ask your nurse or doctor.        amLODipine 10 MG tablet Commonly known as: NORVASC Take 10 mg by mouth daily.   carvedilol 3.125 MG tablet Commonly known as: COREG   cholecalciferol 1000 units tablet Commonly known as: VITAMIN D Take 6,000 Units by mouth daily.   Eliquis 5 MG Tabs tablet Generic drug: apixaban Take 5 mg by mouth 2 (two) times daily.   finasteride 5 MG tablet Commonly known as: PROSCAR Take by mouth.   fluticasone 50 MCG/ACT nasal spray Commonly known as: FLONASE Place 2 sprays into the nose daily.   furosemide 20 MG  tablet Commonly known as: LASIX Take by mouth.   insulin aspart 100 UNIT/ML injection Commonly known as: novoLOG Inject 10 Units into the skin 3 (three) times daily before meals.   insulin NPH-regular Human (70-30) 100 UNIT/ML injection Inject 50 Units into the skin 2 (two) times daily.   predniSONE 20 MG tablet Commonly known as: DELTASONE Take 2 tablets (40 mg total) by mouth daily.   Proventil HFA 108 (90 Base) MCG/ACT inhaler Generic drug: albuterol Inhale 2 puffs into the lungs every 6 (six) hours as needed.   rosuvastatin 40 MG tablet Commonly known as: CRESTOR Take by mouth.       Allergies: No Known Allergies  Family History: Family History  Adopted: Yes    Social History:  reports that he has been smoking cigars. He has smoked for the past 60.00 years. He has never used smokeless tobacco. He reports that he does not drink alcohol and does not use drugs.  ROS: All other review of systems were reviewed and are negative except what is noted above in HPI  Physical Exam: BP (!) 170/74   Pulse 90   Temp 98.7 F (37.1 C)   Ht 5\' 6"  (1.676 m)   Wt  277 lb (125.6 kg)   BMI 44.71 kg/m   Constitutional:  Alert and oriented, No acute distress. HEENT: [place] AT, moist mucus membranes.  Trachea midline, no masses. Cardiovascular: No clubbing, cyanosis, or edema. Respiratory: Normal respiratory effort, no increased work of breathing. GI: Abdomen is soft, nontender, nondistended, no abdominal masses GU: No CVA tenderness.  Lymph: No cervical or inguinal lymphadenopathy. Skin: No rashes, bruises or suspicious lesions. Neurologic: Grossly intact, no focal deficits, moving all 4 extremities. Psychiatric: Normal mood and affect.  Laboratory Data: Lab Results  Component Value Date   WBC 5.6 11/14/2017   HGB 16.1 11/14/2017   HCT 45.6 11/14/2017   MCV 93.3 11/14/2017   PLT 141 (L) 11/14/2017    Lab Results  Component Value Date   CREATININE 1.59 (H) 11/14/2017     No results found for: PSA  No results found for: TESTOSTERONE  Lab Results  Component Value Date   HGBA1C 6.4 (H) 06/26/2011    Urinalysis    Component Value Date/Time   COLORURINE YELLOW 11/14/2017 [place] 11/14/2017 1152   LABSPEC 1.012 11/14/2017 1152   PHURINE 6.0 11/14/2017 1152   GLUCOSEU NEGATIVE 11/14/2017 1152   HGBUR NEGATIVE 11/14/2017 1152   BILIRUBINUR NEGATIVE 11/14/2017 1152   KETONESUR NEGATIVE 11/14/2017 1152   PROTEINUR 100 (A) 11/14/2017 1152   NITRITE NEGATIVE 11/14/2017 1152   LEUKOCYTESUR NEGATIVE 11/14/2017 1152    Lab Results  Component Value Date   BACTERIA NONE SEEN 11/14/2017    Pertinent Imaging: Renal US 09/08/2019 and 11/29/2019: Images reviewed and disucssed the patient No results found for this or any previous visit.  No results found for this or any previous visit.  No results found for this or any previous visit.  No results found for this or any previous visit.  Results for orders placed during the hospital encounter of 11/29/19  US RENAL  Narrative CLINICAL DATA:  Chronic renal disease with history of acute tubular necrosis  EXAM: RENAL / URINARY TRACT ULTRASOUND COMPLETE  COMPARISON:  Sep 08, 2019  FINDINGS: Right Kidney:  Renal measurements: 12.4 x 6.0 x 6.4 cm = volume: 251.4 mL . Echogenicity and renal cortical thickness are within normal limits. No mass, perinephric fluid, or hydronephrosis visualized. There is a calculus in the mid right kidney measuring 9 mm in length. No evident ureterectasis.  Left Kidney:  Renal measurements: 12.4 x 5.8 x 5.4 cm = volume: 203.5 mL. Echogenicity and renal cortical thickness are within normal limits. No mass, perinephric fluid, or hydronephrosis visualized. There is a calculus in the mid left kidney measuring 4 mm. A second 4 mm calculus is noted more superiorly in the left kidney. No ureterectasis.  Bladder:  Appears normal for degree of bladder  distention.  Other:  None.  IMPRESSION: Nonobstructing calculi in each kidney. Study otherwise unremarkable.   Electronically Signed By: Lowella Grip III M.D. On: 11/30/2019 08:07  No results found for this or any previous visit.  No results found for this or any previous visit.  No results found for this or any previous visit.   Assessment & Plan:    1. Nephrolithiasis -KUB today, will call with results. -We discussed the management of kidney stones. These options include observation, ureteroscopy, shockwave lithotripsy (ESWL) and percutaneous nephrolithotomy (PCNL). We discussed which options are relevant to the patient's stone(s). We discussed the natural history of kidney stones as well as the complications of untreated stones and the impact on quality of life  without treatment as well as with each of the above listed treatments. We also discussed the efficacy of each treatment in its ability to clear the stone burden. With any of these management options I discussed the signs and symptoms of infection and the need for emergent treatment should these be experienced. For each option we discussed the ability of each procedure to clear the patient of their stone burden.   For observation I described the risks which include but are not limited to silent renal damage, life-threatening infection, need for emergent surgery, failure to pass stone and pain.   For ureteroscopy I described the risks which include bleeding, infection, damage to contiguous structures, positioning injury, ureteral stricture, ureteral avulsion, ureteral injury, need for prolonged ureteral stent, inability to perform ureteroscopy, need for an interval procedure, inability to clear stone burden, stent discomfort/pain, heart attack, stroke, pulmonary embolus and the inherent risks with general anesthesia.   For shockwave lithotripsy I described the risks which include arrhythmia, kidney contusion, kidney  hemorrhage, need for transfusion, pain, inability to adequately break up stone, inability to pass stone fragments, [place], infection associated with obstructing stones, need for alternate surgical procedure, need for repeat shockwave lithotripsy, MI, CVA, PE and the inherent risks with anesthesia/conscious sedation.   For PCNL I described the risks including positioning injury, pneumothorax, hydrothorax, need for chest tube, inability to clear stone burden, renal laceration, arterial venous fistula or malformation, need for embolization of kidney, loss of kidney or renal function, need for repeat procedure, need for prolonged nephrostomy tube, ureteral avulsion, MI, CVA, PE and the inherent risks of general anesthesia.   - The patient would like to proceed with ESWL pending KUB   No follow-ups on file.  Nicolette Bang, MD  [facility] [place]

## 2019-12-09 ENCOUNTER — Telehealth: Payer: Self-pay

## 2019-12-09 ENCOUNTER — Other Ambulatory Visit: Payer: Self-pay

## 2019-12-09 NOTE — Progress Notes (Signed)
Error

## 2019-12-09 NOTE — Telephone Encounter (Signed)
Spoke with Ms. Conrad and pt. Pt was asked to come in and sign Litho paperwork. They explained pt is doing 24 hr urine collection test and he would come in tomorrow.

## 2019-12-10 ENCOUNTER — Other Ambulatory Visit: Payer: Self-pay

## 2019-12-10 DIAGNOSIS — N200 Calculus of kidney: Secondary | ICD-10-CM

## 2019-12-10 DIAGNOSIS — R310 Gross hematuria: Secondary | ICD-10-CM | POA: Diagnosis not present

## 2019-12-10 DIAGNOSIS — R809 Proteinuria, unspecified: Secondary | ICD-10-CM | POA: Diagnosis not present

## 2019-12-10 DIAGNOSIS — N189 Chronic kidney disease, unspecified: Secondary | ICD-10-CM | POA: Diagnosis not present

## 2019-12-10 DIAGNOSIS — I129 Hypertensive chronic kidney disease with stage 1 through stage 4 chronic kidney disease, or unspecified chronic kidney disease: Secondary | ICD-10-CM | POA: Diagnosis not present

## 2019-12-10 DIAGNOSIS — E1122 Type 2 diabetes mellitus with diabetic chronic kidney disease: Secondary | ICD-10-CM | POA: Diagnosis not present

## 2019-12-10 DIAGNOSIS — E211 Secondary hyperparathyroidism, not elsewhere classified: Secondary | ICD-10-CM | POA: Diagnosis not present

## 2019-12-10 DIAGNOSIS — N170 Acute kidney failure with tubular necrosis: Secondary | ICD-10-CM | POA: Diagnosis not present

## 2019-12-10 NOTE — Progress Notes (Signed)
Surgical clearance submitted to Dr. Legrand Rams

## 2019-12-13 NOTE — Patient Instructions (Addendum)
Your procedure is scheduled on: 12/21/2019  Report to [facility] at    9:00 AM.  Call this number if you have problems the morning of surgery: [phone]   Remember:   Do not Eat or Drink after midnight         No Smoking the morning of surgery  :  Take these medicines the morning of surgery with A SIP OF WATER: Amlodipine, carvedilol and prednisone              No diabetic medication am of procedure              Take only 1/2 dose of  Novolin 70/30  25 units night before procedure   Do not wear jewelry, make-up or nail polish.  Do not wear lotions, powders, or perfumes. You may wear deodorant.  Do not shave 48 hours prior to surgery. Men may shave face and neck.  Do not bring valuables to the hospital.  Contacts, dentures or bridgework may not be worn into surgery.  Leave suitcase in the car. After surgery it may be brought to your room.  For patients admitted to the hospital, checkout time is 11:00 AM the day of discharge.   Patients discharged the day of surgery will not be allowed to drive home.     Lithotripsy, Care After This sheet gives you information about how to care for yourself after your procedure. Your health care provider may also give you more specific instructions. If you have problems or questions, contact your health care provider. What can I expect after the procedure? After the procedure, it is common to have:  Some blood in your urine. This should only last for a few days.  Soreness in your back, sides, or upper abdomen for a few days.  Blotches or bruises on your back where the pressure wave entered the skin.  Pain, discomfort, or nausea when pieces (fragments) of the kidney stone move through the tube that carries urine from the kidney to the bladder (ureter). Stone fragments may pass soon after the procedure, but they may continue to pass for up to 4-8 weeks. ? If you have severe pain or nausea, contact your health care provider. This may be caused  by a large stone that was not broken up, and this may mean that you need more treatment.  Some pain or discomfort during urination.  Some pain or discomfort in the lower abdomen or (in men) at the base of the penis. Follow these instructions at home: Medicines  Take over-the-counter and prescription medicines only as told by your health care provider.  If you were prescribed an antibiotic medicine, take it as told by your health care provider. Do not stop taking the antibiotic even if you start to feel better.  Do not drive for 24 hours if you were given a medicine to help you relax (sedative).  Do not drive or use heavy machinery while taking prescription pain medicine. Eating and drinking      Drink enough water and fluids to keep your urine clear or pale yellow. This helps any remaining pieces of the stone to pass. It can also help prevent new stones from forming.  Eat plenty of fresh fruits and vegetables.  Follow instructions from your health care provider about eating and drinking restrictions. You may be instructed: ? To reduce how much salt (sodium) you eat or drink. Check ingredients and nutrition facts on packaged foods and beverages. ? To reduce how  much meat you eat.  Eat the recommended amount of calcium for your age and gender. Ask your health care provider how much calcium you should have. General instructions  Get plenty of rest.  Most people can resume normal activities 1-2 days after the procedure. Ask your health care provider what activities are safe for you.  Your health care provider may direct you to lie in a certain position (postural drainage) and tap firmly (percuss) over your kidney area to help stone fragments pass. Follow instructions as told by your health care provider.  If directed, strain all urine through the strainer that was provided by your health care provider. ? Keep all fragments for your health care provider to see. Any stones that are  found may be sent to a medical lab for examination. The stone may be as small as a grain of salt.  Keep all follow-up visits as told by your health care provider. This is important. Contact a health care provider if:  You have pain that is severe or does not get better with medicine.  You have nausea that is severe or does not go away.  You have blood in your urine longer than your health care provider told you to expect.  You have more blood in your urine.  You have pain during urination that does not go away.  You urinate more frequently than usual and this does not go away.  You develop a rash or any other possible signs of an allergic reaction. Get help right away if:  You have severe pain in your back, sides, or upper abdomen.  You have severe pain while urinating.  Your urine is very dark red.  You have blood in your stool (feces).  You cannot pass any urine at all.  You feel a strong urge to urinate after emptying your bladder.  You have a fever or chills.  You develop shortness of breath, difficulty breathing, or chest pain.  You have severe nausea that leads to persistent vomiting.  You faint. Summary  After this procedure, it is common to have some pain, discomfort, or nausea when pieces (fragments) of the kidney stone move through the tube that carries urine from the kidney to the bladder (ureter). If this pain or nausea is severe, however, you should contact your health care provider.  Most people can resume normal activities 1-2 days after the procedure. Ask your health care provider what activities are safe for you.  Drink enough water and fluids to keep your urine clear or pale yellow. This helps any remaining pieces of the stone to pass, and it can help prevent new stones from forming.  If directed, strain your urine and keep all fragments for your health care provider to see. Fragments or stones may be as small as a grain of salt.  Get help right away  if you have severe pain in your back, sides, or upper abdomen or have severe pain while urinating. This information is not intended to replace advice given to you by your health care provider. Make sure you discuss any questions you have with your health care provider. Document Revised: 07/13/2018 Document Reviewed: 02/21/2016 Elsevier Patient Education  2020 [place] After These instructions provide you with information about caring for yourself after your procedure. Your health care provider may also give you more specific instructions. Your treatment has been planned according to current medical practices, but problems sometimes occur. Call your health care provider  if you have any problems or questions after your procedure. What can I expect after the procedure? After your procedure, you may:  Feel sleepy for several hours.  Feel clumsy and have poor balance for several hours.  Feel forgetful about what happened after the procedure.  Have poor judgment for several hours.  Feel nauseous or vomit.  Have a sore throat if you had a breathing tube during the procedure. Follow these instructions at home: For at least 24 hours after the procedure:      Have a responsible adult stay with you. It is important to have someone help care for you until you are awake and alert.  Rest as needed.  Do not: ? Participate in activities in which you could fall or become injured. ? Drive. ? Use heavy machinery. ? Drink alcohol. ? Take sleeping pills or medicines that cause drowsiness. ? Make important decisions or sign legal documents. ? Take care of children on your own. Eating and drinking  Follow the diet that is recommended by your health care provider.  If you vomit, drink water, juice, or soup when you can drink without vomiting.  Make sure you have little or no nausea before eating solid foods. General instructions  Take over-the-counter  and prescription medicines only as told by your health care provider.  If you have sleep apnea, surgery and certain medicines can increase your risk for breathing problems. Follow instructions from your health care provider about wearing your sleep device: ? Anytime you are sleeping, including during daytime naps. ? While taking prescription pain medicines, sleeping medicines, or medicines that make you drowsy.  If you smoke, do not smoke without supervision.  Keep all follow-up visits as told by your health care provider. This is important. Contact a health care provider if:  You keep feeling nauseous or you keep vomiting.  You feel light-headed.  You develop a rash.  You have a fever. Get help right away if:  You have trouble breathing. Summary  For several hours after your procedure, you may feel sleepy and have poor judgment.  Have a responsible adult stay with you for at least 24 hours or until you are awake and alert. This information is not intended to replace advice given to you by your health care provider. Make sure you discuss any questions you have with your health care provider. Document Revised: 06/30/2017 Document Reviewed: 07/23/2015 Elsevier Patient Education  [place].

## 2019-12-14 ENCOUNTER — Telehealth: Payer: Self-pay

## 2019-12-14 NOTE — Telephone Encounter (Signed)
-----   Message from Josue Hector sent at 12/14/2019 11:02 AM EDT ----- Marykay Lex,  This patient is for [facility] on 9/7.  Pre op needs him to come in for bloodwork before he can have the Litho done because of his medications.  I left him 2 messages yesterday and another one today.  Can you please try to get in touch with him and have him call me.  He has to come in for a covid test anyway, we could do bloodwork then.  Thanks, Hoyle Sauer

## 2019-12-14 NOTE — Telephone Encounter (Signed)
Called and left a message stressing importance  Of returning the call for pre op.

## 2019-12-15 ENCOUNTER — Encounter (HOSPITAL_COMMUNITY)
Admission: RE | Admit: 2019-12-15 | Discharge: 2019-12-15 | Disposition: A | Discharge status: Home / Self Care | Payer: Medicare HMO | Source: Ambulatory Visit | Attending: Urology | Admitting: Urology

## 2019-12-15 ENCOUNTER — Other Ambulatory Visit: Payer: Self-pay

## 2019-12-17 ENCOUNTER — Telehealth: Payer: Self-pay

## 2019-12-17 ENCOUNTER — Other Ambulatory Visit: Payer: Self-pay

## 2019-12-17 ENCOUNTER — Other Ambulatory Visit (HOSPITAL_COMMUNITY)
Admission: RE | Admit: 2019-12-17 | Discharge: 2019-12-17 | Disposition: A | Discharge status: Home / Self Care | Payer: Medicare HMO | Source: Ambulatory Visit | Attending: Urology | Admitting: Urology

## 2019-12-17 DIAGNOSIS — Z20822 Contact with and (suspected) exposure to covid-19: Secondary | ICD-10-CM | POA: Diagnosis not present

## 2019-12-17 DIAGNOSIS — Z01810 Encounter for preprocedural cardiovascular examination: Secondary | ICD-10-CM | POA: Insufficient documentation

## 2019-12-17 DIAGNOSIS — Z01818 Encounter for other preprocedural examination: Secondary | ICD-10-CM | POA: Diagnosis not present

## 2019-12-17 DIAGNOSIS — Z01812 Encounter for preprocedural laboratory examination: Secondary | ICD-10-CM | POA: Insufficient documentation

## 2019-12-17 LAB — BASIC METABOLIC PANEL
Anion gap: 12 (ref 5–15)
BUN: 30 mg/dL — ABNORMAL HIGH (ref 8–23)
CO2: 25 mmol/L (ref 22–32)
Calcium: 9.2 mg/dL (ref 8.9–10.3)
Chloride: 100 mmol/L (ref 98–111)
Creatinine, Ser: 1.64 mg/dL — ABNORMAL HIGH (ref 0.61–1.24)
GFR calc Af Amer: 47 mL/min — ABNORMAL LOW (ref >60)
GFR calc non Af Amer: 40 mL/min — ABNORMAL LOW (ref >60)
Glucose, Bld: 248 mg/dL — ABNORMAL HIGH (ref 70–99)
Potassium: 4 mmol/L (ref 3.5–5.1)
Sodium: 137 mmol/L (ref 135–145)

## 2019-12-17 LAB — HEMOGLOBIN A1C
Hgb A1c MFr Bld: 7.1 % — ABNORMAL HIGH (ref 4.8–5.6)
Mean Plasma Glucose: 157.07 mg/dL

## 2019-12-17 NOTE — Telephone Encounter (Signed)
Pt called and made aware to Eliquis starting tomorrow and may resume on 12/24/2019 per Dr. Legrand Rams for upcoming [place] on 12/21/2019  Pt voiced understanding.

## 2019-12-18 LAB — SARS CORONAVIRUS 2 (TAT 6-24 HRS): SARS Coronavirus 2: NEGATIVE

## 2019-12-21 ENCOUNTER — Ambulatory Visit (HOSPITAL_COMMUNITY)
Admission: RE | Admit: 2019-12-21 | Discharge: 2019-12-21 | Disposition: A | Discharge status: Home / Self Care | Payer: Medicare HMO | Attending: Urology | Admitting: Urology

## 2019-12-21 ENCOUNTER — Encounter (HOSPITAL_COMMUNITY)
Admission: RE | Disposition: A | Discharge status: Home / Self Care | Payer: Self-pay | Source: Home / Self Care | Attending: Urology

## 2019-12-21 ENCOUNTER — Ambulatory Visit (HOSPITAL_COMMUNITY)
Admission: RE | Admit: 2019-12-21 | Discharge: 2019-12-21 | Disposition: A | Discharge status: Home / Self Care | Payer: Medicare HMO | Source: Home / Self Care | Attending: Urology | Admitting: Urology

## 2019-12-21 DIAGNOSIS — N189 Chronic kidney disease, unspecified: Secondary | ICD-10-CM | POA: Diagnosis not present

## 2019-12-21 DIAGNOSIS — I129 Hypertensive chronic kidney disease with stage 1 through stage 4 chronic kidney disease, or unspecified chronic kidney disease: Secondary | ICD-10-CM | POA: Insufficient documentation

## 2019-12-21 DIAGNOSIS — E1140 Type 2 diabetes mellitus with diabetic neuropathy, unspecified: Secondary | ICD-10-CM | POA: Diagnosis not present

## 2019-12-21 DIAGNOSIS — E669 Obesity, unspecified: Secondary | ICD-10-CM | POA: Insufficient documentation

## 2019-12-21 DIAGNOSIS — Z7901 Long term (current) use of anticoagulants: Secondary | ICD-10-CM | POA: Diagnosis not present

## 2019-12-21 DIAGNOSIS — M47816 Spondylosis without myelopathy or radiculopathy, lumbar region: Secondary | ICD-10-CM | POA: Diagnosis not present

## 2019-12-21 DIAGNOSIS — J449 Chronic obstructive pulmonary disease, unspecified: Secondary | ICD-10-CM | POA: Diagnosis not present

## 2019-12-21 DIAGNOSIS — G4733 Obstructive sleep apnea (adult) (pediatric): Secondary | ICD-10-CM | POA: Insufficient documentation

## 2019-12-21 DIAGNOSIS — N200 Calculus of kidney: Secondary | ICD-10-CM | POA: Insufficient documentation

## 2019-12-21 DIAGNOSIS — M1990 Unspecified osteoarthritis, unspecified site: Secondary | ICD-10-CM | POA: Diagnosis not present

## 2019-12-21 DIAGNOSIS — E1122 Type 2 diabetes mellitus with diabetic chronic kidney disease: Secondary | ICD-10-CM | POA: Insufficient documentation

## 2019-12-21 DIAGNOSIS — I480 Paroxysmal atrial fibrillation: Secondary | ICD-10-CM | POA: Insufficient documentation

## 2019-12-21 DIAGNOSIS — Z794 Long term (current) use of insulin: Secondary | ICD-10-CM | POA: Diagnosis not present

## 2019-12-21 DIAGNOSIS — N2889 Other specified disorders of kidney and ureter: Secondary | ICD-10-CM | POA: Diagnosis not present

## 2019-12-21 DIAGNOSIS — F17290 Nicotine dependence, other tobacco product, uncomplicated: Secondary | ICD-10-CM | POA: Insufficient documentation

## 2019-12-21 DIAGNOSIS — Z6841 Body Mass Index (BMI) 40.0 and over, adult: Secondary | ICD-10-CM | POA: Diagnosis not present

## 2019-12-21 HISTORY — PX: EXTRACORPOREAL SHOCK WAVE LITHOTRIPSY: SHX1557

## 2019-12-21 LAB — GLUCOSE, CAPILLARY: Glucose-Capillary: 143 mg/dL — ABNORMAL HIGH (ref 70–99)

## 2019-12-21 SURGERY — LITHOTRIPSY, ESWL
Anesthesia: LOCAL | Laterality: Left

## 2019-12-21 MED ORDER — ONDANSETRON 4 MG PO TBDP: 4.0000 mg | ORAL_TABLET | Freq: Three times a day (TID) | ORAL | 0 refills | Status: DC | PRN

## 2019-12-21 MED ORDER — TAMSULOSIN HCL 0.4 MG PO CAPS: 0.4000 mg | ORAL_CAPSULE | Freq: Every day | ORAL | 0 refills | Status: DC

## 2019-12-21 MED ORDER — OXYCODONE-ACETAMINOPHEN 5-325 MG PO TABS: 1.0000 | ORAL_TABLET | ORAL | 0 refills | Status: AC | PRN

## 2019-12-21 MED ORDER — SODIUM CHLORIDE 0.9 % IV SOLN: INTRAVENOUS | Status: DC

## 2019-12-21 MED ORDER — DIPHENHYDRAMINE HCL 25 MG PO CAPS
25.0000 mg | ORAL_CAPSULE | ORAL | Status: AC
  Administered 2019-12-21: 25 mg via ORAL
  Filled 2019-12-21: qty 1

## 2019-12-21 MED ORDER — DIAZEPAM 5 MG PO TABS
10.0000 mg | ORAL_TABLET | Freq: Once | ORAL | Status: AC
  Administered 2019-12-21: 10 mg via ORAL
  Filled 2019-12-21: qty 2

## 2019-12-21 NOTE — Discharge Instructions (Signed)
Lithotripsy, Care After This sheet gives you information about how to care for yourself after your procedure. Your health care provider may also give you more specific instructions. If you have problems or questions, contact your health care provider. What can I expect after the procedure? After the procedure, it is common to have:  Some blood in your urine. This should only last for a few days.  Soreness in your back, sides, or upper abdomen for a few days.  Blotches or bruises on your back where the pressure wave entered the skin.  Pain, discomfort, or nausea when pieces (fragments) of the kidney stone move through the tube that carries urine from the kidney to the bladder (ureter). Stone fragments may pass soon after the procedure, but they may continue to pass for up to 4-8 weeks. ? If you have severe pain or nausea, contact your health care provider. This may be caused by a large stone that was not broken up, and this may mean that you need more treatment.  Some pain or discomfort during urination.  Some pain or discomfort in the lower abdomen or (in men) at the base of the penis. Follow these instructions at home: Medicines  Take over-the-counter and prescription medicines only as told by your health care provider.  If you were prescribed an antibiotic medicine, take it as told by your health care provider. Do not stop taking the antibiotic even if you start to feel better.  Do not drive for 24 hours if you were given a medicine to help you relax (sedative).  Do not drive or use heavy machinery while taking prescription pain medicine. Eating and drinking      Drink enough water and fluids to keep your urine clear or pale yellow. This helps any remaining pieces of the stone to pass. It can also help prevent new stones from forming.  Eat plenty of fresh fruits and vegetables.  Follow instructions from your health care provider about eating and drinking restrictions.  You may be instructed: ? To reduce how much salt (sodium) you eat or drink. Check ingredients and nutrition facts on packaged foods and beverages. ? To reduce how much meat you eat.  Eat the recommended amount of calcium for your age and gender. Ask your health care provider how much calcium you should have. General instructions  Get plenty of rest.  Most people can resume normal activities 1-2 days after the procedure. Ask your health care provider what activities are safe for you.  Your health care provider may direct you to lie in a certain position (postural drainage) and tap firmly (percuss) over your kidney area to help stone fragments pass. Follow instructions as told by your health care provider.  If directed, strain all urine through the strainer that was provided by your health care provider. ? Keep all fragments for your health care provider to see. Any stones that are found may be sent to a medical lab for examination. The stone may be as small as a grain of salt.  Keep all follow-up visits as told by your health care provider. This is important. Contact a health care provider if:  You have pain that is severe or does not get better with medicine.  You have nausea that is severe or does not go away.  You have blood in your urine longer than your health care provider told you to expect.  You have more blood in your urine.  You have pain during  urination that does not go away.  You urinate more frequently than usual and this does not go away.  You develop a rash or any other possible signs of an allergic reaction. Get help right away if:  You have severe pain in your back, sides, or upper abdomen.  You have severe pain while urinating.  Your urine is very dark red.  You have blood in your stool (feces).  You cannot pass any urine at all.  You feel a strong urge to urinate after emptying your bladder.  You have a fever or chills.  You develop shortness of  breath, difficulty breathing, or chest pain.  You have severe nausea that leads to persistent vomiting.  You faint. Summary  After this procedure, it is common to have some pain, discomfort, or nausea when pieces (fragments) of the kidney stone move through the tube that carries urine from the kidney to the bladder (ureter). If this pain or nausea is severe, however, you should contact your health care provider.  Most people can resume normal activities 1-2 days after the procedure. Ask your health care provider what activities are safe for you.  Drink enough water and fluids to keep your urine clear or pale yellow. This helps any remaining pieces of the stone to pass, and it can help prevent new stones from forming.  If directed, strain your urine and keep all fragments for your health care provider to see. Fragments or stones may be as small as a grain of salt.  Get help right away if you have severe pain in your back, sides, or upper abdomen or have severe pain while urinating. This information is not intended to replace advice given to you by your health care provider. Make sure you discuss any questions you have with your health care provider. Document Revised: 07/13/2018 Document Reviewed: 02/21/2016 Elsevier Patient Education  2020 [place] After These instructions provide you with information about caring for yourself after your procedure. Your health care provider may also give you more specific instructions. Your treatment has been planned according to current medical practices, but problems sometimes occur. Call your health care provider if you have any problems or questions after your procedure. What can I expect after the procedure? After your procedure, you may:  Feel sleepy for several hours.  Feel clumsy and have poor balance for several hours.  Feel forgetful about what happened after the procedure.  Have poor judgment for several  hours.  Feel nauseous or vomit.  Have a sore throat if you had a breathing tube during the procedure. Follow these instructions at home: For at least 24 hours after the procedure:        Have a responsible adult stay with you. It is important to have someone help care for you until you are awake and alert.  Rest as needed.  Do not: ? Participate in activities in which you could fall or become injured. ? Drive. ? Use heavy machinery. ? Drink alcohol. ? Take sleeping pills or medicines that cause drowsiness. ? Make important decisions or sign legal documents. ? Take care of children on your own. Eating and drinking  Follow the diet that is recommended by your health care provider.  If you vomit, drink water, juice, or soup when you can drink without vomiting.  Make sure you have little or no nausea before eating solid foods. General instructions  Take over-the-counter and prescription medicines only as told by your health  care provider.  If you have sleep apnea, surgery and certain medicines can increase your risk for breathing problems. Follow instructions from your health care provider about wearing your sleep device: ? Anytime you are sleeping, including during daytime naps. ? While taking prescription pain medicines, sleeping medicines, or medicines that make you drowsy.  If you smoke, do not smoke without supervision.  Keep all follow-up visits as told by your health care provider. This is important. Contact a health care provider if:  You keep feeling nauseous or you keep vomiting.  You feel light-headed.  You develop a rash.  You have a fever. Get help right away if:  You have trouble breathing. Summary  For several hours after your procedure, you may feel sleepy and have poor judgment.  Have a responsible adult stay with you for at least 24 hours or until you are awake and alert. This information is not intended to replace advice given to you by your  health care provider. Make sure you discuss any questions you have with your health care provider. Document Revised: 06/30/2017 Document Reviewed: 07/23/2015 Elsevier Patient Education  [place].

## 2019-12-21 NOTE — Interval H&P Note (Signed)
History and Physical Interval Note:  12/21/2019 8:59 AM  Craig Black  has presented today for surgery, with the diagnosis of left renal calculus.  The various methods of treatment have been discussed with the patient and family. After consideration of risks, benefits and other options for treatment, the patient has consented to  Procedure(s): EXTRACORPOREAL SHOCK WAVE LITHOTRIPSY (ESWL) (Left) as a surgical intervention.  The patient's history has been reviewed, patient examined, no change in status, stable for surgery.  I have reviewed the patient's chart and labs.  Questions were answered to the patient's satisfaction.     Nicolette Bang

## 2019-12-22 ENCOUNTER — Encounter (HOSPITAL_COMMUNITY): Payer: Self-pay | Admitting: Urology

## 2019-12-24 DIAGNOSIS — E1165 Type 2 diabetes mellitus with hyperglycemia: Secondary | ICD-10-CM | POA: Diagnosis not present

## 2019-12-24 DIAGNOSIS — I10 Essential (primary) hypertension: Secondary | ICD-10-CM | POA: Diagnosis not present

## 2019-12-31 DIAGNOSIS — E1122 Type 2 diabetes mellitus with diabetic chronic kidney disease: Secondary | ICD-10-CM | POA: Diagnosis not present

## 2019-12-31 DIAGNOSIS — R809 Proteinuria, unspecified: Secondary | ICD-10-CM | POA: Diagnosis not present

## 2019-12-31 DIAGNOSIS — N170 Acute kidney failure with tubular necrosis: Secondary | ICD-10-CM | POA: Diagnosis not present

## 2019-12-31 DIAGNOSIS — E1129 Type 2 diabetes mellitus with other diabetic kidney complication: Secondary | ICD-10-CM | POA: Diagnosis not present

## 2019-12-31 DIAGNOSIS — I129 Hypertensive chronic kidney disease with stage 1 through stage 4 chronic kidney disease, or unspecified chronic kidney disease: Secondary | ICD-10-CM | POA: Diagnosis not present

## 2019-12-31 DIAGNOSIS — R82991 Hypocitraturia: Secondary | ICD-10-CM | POA: Diagnosis not present

## 2019-12-31 DIAGNOSIS — E211 Secondary hyperparathyroidism, not elsewhere classified: Secondary | ICD-10-CM | POA: Diagnosis not present

## 2019-12-31 DIAGNOSIS — N189 Chronic kidney disease, unspecified: Secondary | ICD-10-CM | POA: Diagnosis not present

## 2020-01-05 ENCOUNTER — Ambulatory Visit: Payer: Medicare HMO | Admitting: Urology

## 2020-01-05 DIAGNOSIS — Z23 Encounter for immunization: Secondary | ICD-10-CM | POA: Diagnosis not present

## 2020-02-04 DIAGNOSIS — E1165 Type 2 diabetes mellitus with hyperglycemia: Secondary | ICD-10-CM | POA: Diagnosis not present

## 2020-02-04 DIAGNOSIS — I10 Essential (primary) hypertension: Secondary | ICD-10-CM | POA: Diagnosis not present

## 2020-02-25 DIAGNOSIS — E1122 Type 2 diabetes mellitus with diabetic chronic kidney disease: Secondary | ICD-10-CM | POA: Diagnosis not present

## 2020-02-25 DIAGNOSIS — R809 Proteinuria, unspecified: Secondary | ICD-10-CM | POA: Diagnosis not present

## 2020-02-25 DIAGNOSIS — I129 Hypertensive chronic kidney disease with stage 1 through stage 4 chronic kidney disease, or unspecified chronic kidney disease: Secondary | ICD-10-CM | POA: Diagnosis not present

## 2020-02-25 DIAGNOSIS — E1129 Type 2 diabetes mellitus with other diabetic kidney complication: Secondary | ICD-10-CM | POA: Diagnosis not present

## 2020-02-25 DIAGNOSIS — N189 Chronic kidney disease, unspecified: Secondary | ICD-10-CM | POA: Diagnosis not present

## 2020-02-25 DIAGNOSIS — R82991 Hypocitraturia: Secondary | ICD-10-CM | POA: Diagnosis not present

## 2020-02-25 DIAGNOSIS — E211 Secondary hyperparathyroidism, not elsewhere classified: Secondary | ICD-10-CM | POA: Diagnosis not present

## 2020-02-25 DIAGNOSIS — N170 Acute kidney failure with tubular necrosis: Secondary | ICD-10-CM | POA: Diagnosis not present

## 2020-02-28 DIAGNOSIS — E1165 Type 2 diabetes mellitus with hyperglycemia: Secondary | ICD-10-CM | POA: Diagnosis not present

## 2020-02-28 DIAGNOSIS — Z1331 Encounter for screening for depression: Secondary | ICD-10-CM | POA: Diagnosis not present

## 2020-02-28 DIAGNOSIS — I4821 Permanent atrial fibrillation: Secondary | ICD-10-CM | POA: Diagnosis not present

## 2020-02-28 DIAGNOSIS — F17210 Nicotine dependence, cigarettes, uncomplicated: Secondary | ICD-10-CM | POA: Diagnosis not present

## 2020-02-28 DIAGNOSIS — Z1389 Encounter for screening for other disorder: Secondary | ICD-10-CM | POA: Diagnosis not present

## 2020-02-28 DIAGNOSIS — I10 Essential (primary) hypertension: Secondary | ICD-10-CM | POA: Diagnosis not present

## 2020-02-28 DIAGNOSIS — Z0001 Encounter for general adult medical examination with abnormal findings: Secondary | ICD-10-CM | POA: Diagnosis not present

## 2020-03-02 DIAGNOSIS — R809 Proteinuria, unspecified: Secondary | ICD-10-CM | POA: Diagnosis not present

## 2020-03-02 DIAGNOSIS — E1129 Type 2 diabetes mellitus with other diabetic kidney complication: Secondary | ICD-10-CM | POA: Diagnosis not present

## 2020-03-02 DIAGNOSIS — E211 Secondary hyperparathyroidism, not elsewhere classified: Secondary | ICD-10-CM | POA: Diagnosis not present

## 2020-03-02 DIAGNOSIS — N170 Acute kidney failure with tubular necrosis: Secondary | ICD-10-CM | POA: Diagnosis not present

## 2020-03-02 DIAGNOSIS — N1832 Chronic kidney disease, stage 3b: Secondary | ICD-10-CM | POA: Diagnosis not present

## 2020-03-02 DIAGNOSIS — E1122 Type 2 diabetes mellitus with diabetic chronic kidney disease: Secondary | ICD-10-CM | POA: Diagnosis not present

## 2020-03-02 DIAGNOSIS — N189 Chronic kidney disease, unspecified: Secondary | ICD-10-CM | POA: Diagnosis not present

## 2020-03-20 DIAGNOSIS — N189 Chronic kidney disease, unspecified: Secondary | ICD-10-CM | POA: Diagnosis not present

## 2020-03-20 DIAGNOSIS — N170 Acute kidney failure with tubular necrosis: Secondary | ICD-10-CM | POA: Diagnosis not present

## 2020-03-20 DIAGNOSIS — R809 Proteinuria, unspecified: Secondary | ICD-10-CM | POA: Diagnosis not present

## 2020-03-20 DIAGNOSIS — E1129 Type 2 diabetes mellitus with other diabetic kidney complication: Secondary | ICD-10-CM | POA: Diagnosis not present

## 2020-03-20 DIAGNOSIS — E211 Secondary hyperparathyroidism, not elsewhere classified: Secondary | ICD-10-CM | POA: Diagnosis not present

## 2020-03-20 DIAGNOSIS — E1122 Type 2 diabetes mellitus with diabetic chronic kidney disease: Secondary | ICD-10-CM | POA: Diagnosis not present

## 2020-03-29 DIAGNOSIS — E1165 Type 2 diabetes mellitus with hyperglycemia: Secondary | ICD-10-CM | POA: Diagnosis not present

## 2020-03-29 DIAGNOSIS — I10 Essential (primary) hypertension: Secondary | ICD-10-CM | POA: Diagnosis not present

## 2020-04-29 DIAGNOSIS — E1165 Type 2 diabetes mellitus with hyperglycemia: Secondary | ICD-10-CM | POA: Diagnosis not present

## 2020-04-29 DIAGNOSIS — F17290 Nicotine dependence, other tobacco product, uncomplicated: Secondary | ICD-10-CM | POA: Diagnosis not present

## 2020-05-08 DIAGNOSIS — E211 Secondary hyperparathyroidism, not elsewhere classified: Secondary | ICD-10-CM | POA: Diagnosis not present

## 2020-05-08 DIAGNOSIS — E1129 Type 2 diabetes mellitus with other diabetic kidney complication: Secondary | ICD-10-CM | POA: Diagnosis not present

## 2020-05-08 DIAGNOSIS — N189 Chronic kidney disease, unspecified: Secondary | ICD-10-CM | POA: Diagnosis not present

## 2020-05-08 DIAGNOSIS — N170 Acute kidney failure with tubular necrosis: Secondary | ICD-10-CM | POA: Diagnosis not present

## 2020-05-08 DIAGNOSIS — R809 Proteinuria, unspecified: Secondary | ICD-10-CM | POA: Diagnosis not present

## 2020-05-08 DIAGNOSIS — E1122 Type 2 diabetes mellitus with diabetic chronic kidney disease: Secondary | ICD-10-CM | POA: Diagnosis not present

## 2020-05-10 DIAGNOSIS — I129 Hypertensive chronic kidney disease with stage 1 through stage 4 chronic kidney disease, or unspecified chronic kidney disease: Secondary | ICD-10-CM | POA: Diagnosis not present

## 2020-05-10 DIAGNOSIS — E1122 Type 2 diabetes mellitus with diabetic chronic kidney disease: Secondary | ICD-10-CM | POA: Diagnosis not present

## 2020-05-10 DIAGNOSIS — N200 Calculus of kidney: Secondary | ICD-10-CM | POA: Diagnosis not present

## 2020-05-10 DIAGNOSIS — E211 Secondary hyperparathyroidism, not elsewhere classified: Secondary | ICD-10-CM | POA: Diagnosis not present

## 2020-05-10 DIAGNOSIS — E1129 Type 2 diabetes mellitus with other diabetic kidney complication: Secondary | ICD-10-CM | POA: Diagnosis not present

## 2020-05-10 DIAGNOSIS — R82991 Hypocitraturia: Secondary | ICD-10-CM | POA: Diagnosis not present

## 2020-05-10 DIAGNOSIS — N189 Chronic kidney disease, unspecified: Secondary | ICD-10-CM | POA: Diagnosis not present

## 2020-05-10 DIAGNOSIS — R809 Proteinuria, unspecified: Secondary | ICD-10-CM | POA: Diagnosis not present

## 2020-05-30 DIAGNOSIS — E1165 Type 2 diabetes mellitus with hyperglycemia: Secondary | ICD-10-CM | POA: Diagnosis not present

## 2020-05-30 DIAGNOSIS — F17290 Nicotine dependence, other tobacco product, uncomplicated: Secondary | ICD-10-CM | POA: Diagnosis not present

## 2020-06-27 DIAGNOSIS — E1165 Type 2 diabetes mellitus with hyperglycemia: Secondary | ICD-10-CM | POA: Diagnosis not present

## 2020-06-27 DIAGNOSIS — F17290 Nicotine dependence, other tobacco product, uncomplicated: Secondary | ICD-10-CM | POA: Diagnosis not present

## 2020-07-11 DIAGNOSIS — I129 Hypertensive chronic kidney disease with stage 1 through stage 4 chronic kidney disease, or unspecified chronic kidney disease: Secondary | ICD-10-CM | POA: Diagnosis not present

## 2020-07-11 DIAGNOSIS — R809 Proteinuria, unspecified: Secondary | ICD-10-CM | POA: Diagnosis not present

## 2020-07-11 DIAGNOSIS — E1122 Type 2 diabetes mellitus with diabetic chronic kidney disease: Secondary | ICD-10-CM | POA: Diagnosis not present

## 2020-07-11 DIAGNOSIS — R82991 Hypocitraturia: Secondary | ICD-10-CM | POA: Diagnosis not present

## 2020-07-11 DIAGNOSIS — N189 Chronic kidney disease, unspecified: Secondary | ICD-10-CM | POA: Diagnosis not present

## 2020-07-11 DIAGNOSIS — N200 Calculus of kidney: Secondary | ICD-10-CM | POA: Diagnosis not present

## 2020-07-11 DIAGNOSIS — E1129 Type 2 diabetes mellitus with other diabetic kidney complication: Secondary | ICD-10-CM | POA: Diagnosis not present

## 2020-07-11 DIAGNOSIS — E211 Secondary hyperparathyroidism, not elsewhere classified: Secondary | ICD-10-CM | POA: Diagnosis not present

## 2020-07-12 DIAGNOSIS — R809 Proteinuria, unspecified: Secondary | ICD-10-CM | POA: Diagnosis not present

## 2020-07-12 DIAGNOSIS — E211 Secondary hyperparathyroidism, not elsewhere classified: Secondary | ICD-10-CM | POA: Diagnosis not present

## 2020-07-12 DIAGNOSIS — E1122 Type 2 diabetes mellitus with diabetic chronic kidney disease: Secondary | ICD-10-CM | POA: Diagnosis not present

## 2020-07-12 DIAGNOSIS — E1129 Type 2 diabetes mellitus with other diabetic kidney complication: Secondary | ICD-10-CM | POA: Diagnosis not present

## 2020-07-12 DIAGNOSIS — I129 Hypertensive chronic kidney disease with stage 1 through stage 4 chronic kidney disease, or unspecified chronic kidney disease: Secondary | ICD-10-CM | POA: Diagnosis not present

## 2020-07-12 DIAGNOSIS — N189 Chronic kidney disease, unspecified: Secondary | ICD-10-CM | POA: Diagnosis not present

## 2020-07-28 DIAGNOSIS — E1165 Type 2 diabetes mellitus with hyperglycemia: Secondary | ICD-10-CM | POA: Diagnosis not present

## 2020-07-28 DIAGNOSIS — I10 Essential (primary) hypertension: Secondary | ICD-10-CM | POA: Diagnosis not present

## 2020-08-23 DIAGNOSIS — F17210 Nicotine dependence, cigarettes, uncomplicated: Secondary | ICD-10-CM | POA: Diagnosis not present

## 2020-08-23 DIAGNOSIS — E1165 Type 2 diabetes mellitus with hyperglycemia: Secondary | ICD-10-CM | POA: Diagnosis not present

## 2020-08-23 DIAGNOSIS — I4821 Permanent atrial fibrillation: Secondary | ICD-10-CM | POA: Diagnosis not present

## 2020-08-23 DIAGNOSIS — J449 Chronic obstructive pulmonary disease, unspecified: Secondary | ICD-10-CM | POA: Diagnosis not present

## 2020-09-05 DIAGNOSIS — I129 Hypertensive chronic kidney disease with stage 1 through stage 4 chronic kidney disease, or unspecified chronic kidney disease: Secondary | ICD-10-CM | POA: Diagnosis not present

## 2020-09-05 DIAGNOSIS — E1129 Type 2 diabetes mellitus with other diabetic kidney complication: Secondary | ICD-10-CM | POA: Diagnosis not present

## 2020-09-05 DIAGNOSIS — E211 Secondary hyperparathyroidism, not elsewhere classified: Secondary | ICD-10-CM | POA: Diagnosis not present

## 2020-09-05 DIAGNOSIS — E1122 Type 2 diabetes mellitus with diabetic chronic kidney disease: Secondary | ICD-10-CM | POA: Diagnosis not present

## 2020-09-05 DIAGNOSIS — R809 Proteinuria, unspecified: Secondary | ICD-10-CM | POA: Diagnosis not present

## 2020-09-05 DIAGNOSIS — N189 Chronic kidney disease, unspecified: Secondary | ICD-10-CM | POA: Diagnosis not present

## 2020-09-13 DIAGNOSIS — N1832 Chronic kidney disease, stage 3b: Secondary | ICD-10-CM | POA: Diagnosis not present

## 2020-09-13 DIAGNOSIS — E211 Secondary hyperparathyroidism, not elsewhere classified: Secondary | ICD-10-CM | POA: Diagnosis not present

## 2020-09-13 DIAGNOSIS — N200 Calculus of kidney: Secondary | ICD-10-CM | POA: Diagnosis not present

## 2020-09-13 DIAGNOSIS — E1129 Type 2 diabetes mellitus with other diabetic kidney complication: Secondary | ICD-10-CM | POA: Diagnosis not present

## 2020-09-13 DIAGNOSIS — E1122 Type 2 diabetes mellitus with diabetic chronic kidney disease: Secondary | ICD-10-CM | POA: Diagnosis not present

## 2020-09-13 DIAGNOSIS — N189 Chronic kidney disease, unspecified: Secondary | ICD-10-CM | POA: Diagnosis not present

## 2020-09-13 DIAGNOSIS — R809 Proteinuria, unspecified: Secondary | ICD-10-CM | POA: Diagnosis not present

## 2020-09-13 DIAGNOSIS — I129 Hypertensive chronic kidney disease with stage 1 through stage 4 chronic kidney disease, or unspecified chronic kidney disease: Secondary | ICD-10-CM | POA: Diagnosis not present

## 2020-09-23 DIAGNOSIS — I10 Essential (primary) hypertension: Secondary | ICD-10-CM | POA: Diagnosis not present

## 2020-09-23 DIAGNOSIS — E1165 Type 2 diabetes mellitus with hyperglycemia: Secondary | ICD-10-CM | POA: Diagnosis not present

## 2020-10-23 DIAGNOSIS — E1165 Type 2 diabetes mellitus with hyperglycemia: Secondary | ICD-10-CM | POA: Diagnosis not present

## 2020-10-23 DIAGNOSIS — I10 Essential (primary) hypertension: Secondary | ICD-10-CM | POA: Diagnosis not present

## 2020-11-20 DIAGNOSIS — N189 Chronic kidney disease, unspecified: Secondary | ICD-10-CM | POA: Diagnosis not present

## 2020-11-20 DIAGNOSIS — E1122 Type 2 diabetes mellitus with diabetic chronic kidney disease: Secondary | ICD-10-CM | POA: Diagnosis not present

## 2020-11-20 DIAGNOSIS — E211 Secondary hyperparathyroidism, not elsewhere classified: Secondary | ICD-10-CM | POA: Diagnosis not present

## 2020-11-20 DIAGNOSIS — R809 Proteinuria, unspecified: Secondary | ICD-10-CM | POA: Diagnosis not present

## 2020-11-20 DIAGNOSIS — E1129 Type 2 diabetes mellitus with other diabetic kidney complication: Secondary | ICD-10-CM | POA: Diagnosis not present

## 2020-11-20 DIAGNOSIS — I129 Hypertensive chronic kidney disease with stage 1 through stage 4 chronic kidney disease, or unspecified chronic kidney disease: Secondary | ICD-10-CM | POA: Diagnosis not present

## 2020-11-23 DIAGNOSIS — N189 Chronic kidney disease, unspecified: Secondary | ICD-10-CM | POA: Diagnosis not present

## 2020-11-23 DIAGNOSIS — E1165 Type 2 diabetes mellitus with hyperglycemia: Secondary | ICD-10-CM | POA: Diagnosis not present

## 2020-11-23 DIAGNOSIS — E1129 Type 2 diabetes mellitus with other diabetic kidney complication: Secondary | ICD-10-CM | POA: Diagnosis not present

## 2020-11-23 DIAGNOSIS — R809 Proteinuria, unspecified: Secondary | ICD-10-CM | POA: Diagnosis not present

## 2020-11-23 DIAGNOSIS — I10 Essential (primary) hypertension: Secondary | ICD-10-CM | POA: Diagnosis not present

## 2020-11-23 DIAGNOSIS — E1122 Type 2 diabetes mellitus with diabetic chronic kidney disease: Secondary | ICD-10-CM | POA: Diagnosis not present

## 2020-11-23 DIAGNOSIS — I129 Hypertensive chronic kidney disease with stage 1 through stage 4 chronic kidney disease, or unspecified chronic kidney disease: Secondary | ICD-10-CM | POA: Diagnosis not present

## 2020-11-23 DIAGNOSIS — E211 Secondary hyperparathyroidism, not elsewhere classified: Secondary | ICD-10-CM | POA: Diagnosis not present

## 2021-01-09 DIAGNOSIS — J449 Chronic obstructive pulmonary disease, unspecified: Secondary | ICD-10-CM | POA: Diagnosis not present

## 2021-01-09 DIAGNOSIS — E1165 Type 2 diabetes mellitus with hyperglycemia: Secondary | ICD-10-CM | POA: Diagnosis not present

## 2021-01-17 DIAGNOSIS — R809 Proteinuria, unspecified: Secondary | ICD-10-CM | POA: Diagnosis not present

## 2021-01-17 DIAGNOSIS — E1122 Type 2 diabetes mellitus with diabetic chronic kidney disease: Secondary | ICD-10-CM | POA: Diagnosis not present

## 2021-01-17 DIAGNOSIS — E1129 Type 2 diabetes mellitus with other diabetic kidney complication: Secondary | ICD-10-CM | POA: Diagnosis not present

## 2021-01-17 DIAGNOSIS — N189 Chronic kidney disease, unspecified: Secondary | ICD-10-CM | POA: Diagnosis not present

## 2021-01-17 DIAGNOSIS — I129 Hypertensive chronic kidney disease with stage 1 through stage 4 chronic kidney disease, or unspecified chronic kidney disease: Secondary | ICD-10-CM | POA: Diagnosis not present

## 2021-01-17 DIAGNOSIS — E211 Secondary hyperparathyroidism, not elsewhere classified: Secondary | ICD-10-CM | POA: Diagnosis not present

## 2021-01-26 DIAGNOSIS — I129 Hypertensive chronic kidney disease with stage 1 through stage 4 chronic kidney disease, or unspecified chronic kidney disease: Secondary | ICD-10-CM | POA: Diagnosis not present

## 2021-01-26 DIAGNOSIS — N189 Chronic kidney disease, unspecified: Secondary | ICD-10-CM | POA: Diagnosis not present

## 2021-01-26 DIAGNOSIS — R809 Proteinuria, unspecified: Secondary | ICD-10-CM | POA: Diagnosis not present

## 2021-01-26 DIAGNOSIS — E1122 Type 2 diabetes mellitus with diabetic chronic kidney disease: Secondary | ICD-10-CM | POA: Diagnosis not present

## 2021-01-26 DIAGNOSIS — E1129 Type 2 diabetes mellitus with other diabetic kidney complication: Secondary | ICD-10-CM | POA: Diagnosis not present

## 2021-01-26 DIAGNOSIS — E211 Secondary hyperparathyroidism, not elsewhere classified: Secondary | ICD-10-CM | POA: Diagnosis not present

## 2021-02-01 DIAGNOSIS — Z23 Encounter for immunization: Secondary | ICD-10-CM | POA: Diagnosis not present

## 2021-02-08 DIAGNOSIS — I10 Essential (primary) hypertension: Secondary | ICD-10-CM | POA: Diagnosis not present

## 2021-02-08 DIAGNOSIS — E1165 Type 2 diabetes mellitus with hyperglycemia: Secondary | ICD-10-CM | POA: Diagnosis not present

## 2021-03-12 ENCOUNTER — Other Ambulatory Visit (HOSPITAL_COMMUNITY): Payer: Self-pay | Admitting: Gerontology

## 2021-03-12 ENCOUNTER — Other Ambulatory Visit: Payer: Self-pay

## 2021-03-12 ENCOUNTER — Other Ambulatory Visit (HOSPITAL_COMMUNITY)
Admission: RE | Admit: 2021-03-12 | Discharge: 2021-03-12 | Disposition: A | Discharge status: Home / Self Care | Payer: Medicare HMO | Source: Ambulatory Visit | Attending: Internal Medicine | Admitting: Internal Medicine

## 2021-03-12 ENCOUNTER — Ambulatory Visit (HOSPITAL_COMMUNITY)
Admission: RE | Admit: 2021-03-12 | Discharge: 2021-03-12 | Disposition: A | Discharge status: Home / Self Care | Payer: Medicare HMO | Source: Ambulatory Visit | Attending: Gerontology | Admitting: Gerontology

## 2021-03-12 DIAGNOSIS — Z1331 Encounter for screening for depression: Secondary | ICD-10-CM | POA: Diagnosis not present

## 2021-03-12 DIAGNOSIS — R059 Cough, unspecified: Secondary | ICD-10-CM

## 2021-03-12 DIAGNOSIS — Z0001 Encounter for general adult medical examination with abnormal findings: Secondary | ICD-10-CM | POA: Insufficient documentation

## 2021-03-12 DIAGNOSIS — Z23 Encounter for immunization: Secondary | ICD-10-CM | POA: Diagnosis not present

## 2021-03-12 DIAGNOSIS — I10 Essential (primary) hypertension: Secondary | ICD-10-CM | POA: Insufficient documentation

## 2021-03-12 DIAGNOSIS — E1165 Type 2 diabetes mellitus with hyperglycemia: Secondary | ICD-10-CM | POA: Insufficient documentation

## 2021-03-12 DIAGNOSIS — E119 Type 2 diabetes mellitus without complications: Secondary | ICD-10-CM | POA: Insufficient documentation

## 2021-03-12 DIAGNOSIS — I4821 Permanent atrial fibrillation: Secondary | ICD-10-CM | POA: Insufficient documentation

## 2021-03-12 DIAGNOSIS — E669 Obesity, unspecified: Secondary | ICD-10-CM | POA: Insufficient documentation

## 2021-03-12 DIAGNOSIS — Z1389 Encounter for screening for other disorder: Secondary | ICD-10-CM | POA: Diagnosis not present

## 2021-03-12 LAB — LIPID PANEL
Cholesterol: 98 mg/dL (ref 0–200)
HDL: 38 mg/dL — ABNORMAL LOW (ref >40)
LDL Cholesterol: 33 mg/dL (ref 0–99)
Total CHOL/HDL Ratio: 2.6 RATIO
Triglycerides: 134 mg/dL (ref <150)
VLDL: 27 mg/dL (ref 0–40)

## 2021-03-12 LAB — CBC WITH DIFFERENTIAL/PLATELET
Abs Immature Granulocytes: 0.02 10*3/uL (ref 0.00–0.07)
Basophils Absolute: 0 10*3/uL (ref 0.0–0.1)
Basophils Relative: 1 %
Eosinophils Absolute: 0.2 10*3/uL (ref 0.0–0.5)
Eosinophils Relative: 2 %
HCT: 44.7 % (ref 39.0–52.0)
Hemoglobin: 15.7 g/dL (ref 13.0–17.0)
Immature Granulocytes: 0 %
Lymphocytes Relative: 17 %
Lymphs Abs: 1.3 10*3/uL (ref 0.7–4.0)
MCH: 33 pg (ref 26.0–34.0)
MCHC: 35.1 g/dL (ref 30.0–36.0)
MCV: 93.9 fL (ref 80.0–100.0)
Monocytes Absolute: 0.8 10*3/uL (ref 0.1–1.0)
Monocytes Relative: 10 %
Neutro Abs: 5.5 10*3/uL (ref 1.7–7.7)
Neutrophils Relative %: 70 %
Platelets: 233 10*3/uL (ref 150–400)
RBC: 4.76 MIL/uL (ref 4.22–5.81)
RDW: 14.1 % (ref 11.5–15.5)
WBC: 7.8 10*3/uL (ref 4.0–10.5)
nRBC: 0 % (ref 0.0–0.2)

## 2021-03-12 LAB — BASIC METABOLIC PANEL
Anion gap: 11 (ref 5–15)
BUN: 46 mg/dL — ABNORMAL HIGH (ref 8–23)
CO2: 28 mmol/L (ref 22–32)
Calcium: 9.5 mg/dL (ref 8.9–10.3)
Chloride: 101 mmol/L (ref 98–111)
Creatinine, Ser: 2.81 mg/dL — ABNORMAL HIGH (ref 0.61–1.24)
GFR, Estimated: 23 mL/min — ABNORMAL LOW (ref >60)
Glucose, Bld: 119 mg/dL — ABNORMAL HIGH (ref 70–99)
Potassium: 3.8 mmol/L (ref 3.5–5.1)
Sodium: 140 mmol/L (ref 135–145)

## 2021-03-12 LAB — HEPATIC FUNCTION PANEL
ALT: 16 U/L (ref 0–44)
AST: 18 U/L (ref 15–41)
Albumin: 3.8 g/dL (ref 3.5–5.0)
Alkaline Phosphatase: 70 U/L (ref 38–126)
Bilirubin, Direct: 0.1 mg/dL (ref 0.0–0.2)
Indirect Bilirubin: 0.7 mg/dL (ref 0.3–0.9)
Total Bilirubin: 0.8 mg/dL (ref 0.3–1.2)
Total Protein: 7 g/dL (ref 6.5–8.1)

## 2021-03-13 LAB — HEMOGLOBIN A1C
Hgb A1c MFr Bld: 6.9 % — ABNORMAL HIGH (ref 4.8–5.6)
Mean Plasma Glucose: 151 mg/dL

## 2021-03-17 ENCOUNTER — Encounter (HOSPITAL_COMMUNITY): Payer: Self-pay | Admitting: *Deleted

## 2021-03-17 ENCOUNTER — Emergency Department (HOSPITAL_COMMUNITY)
Admission: EM | Admit: 2021-03-17 | Discharge: 2021-03-17 | Disposition: A | Discharge status: Home / Self Care | Payer: Medicare HMO | Attending: Emergency Medicine | Admitting: Emergency Medicine

## 2021-03-17 DIAGNOSIS — J449 Chronic obstructive pulmonary disease, unspecified: Secondary | ICD-10-CM | POA: Diagnosis not present

## 2021-03-17 DIAGNOSIS — Z7901 Long term (current) use of anticoagulants: Secondary | ICD-10-CM | POA: Diagnosis not present

## 2021-03-17 DIAGNOSIS — I480 Paroxysmal atrial fibrillation: Secondary | ICD-10-CM | POA: Insufficient documentation

## 2021-03-17 DIAGNOSIS — E1122 Type 2 diabetes mellitus with diabetic chronic kidney disease: Secondary | ICD-10-CM | POA: Diagnosis not present

## 2021-03-17 DIAGNOSIS — F17290 Nicotine dependence, other tobacco product, uncomplicated: Secondary | ICD-10-CM | POA: Diagnosis not present

## 2021-03-17 DIAGNOSIS — N189 Chronic kidney disease, unspecified: Secondary | ICD-10-CM | POA: Insufficient documentation

## 2021-03-17 DIAGNOSIS — I129 Hypertensive chronic kidney disease with stage 1 through stage 4 chronic kidney disease, or unspecified chronic kidney disease: Secondary | ICD-10-CM | POA: Diagnosis not present

## 2021-03-17 DIAGNOSIS — Z79899 Other long term (current) drug therapy: Secondary | ICD-10-CM | POA: Insufficient documentation

## 2021-03-17 DIAGNOSIS — Z7951 Long term (current) use of inhaled steroids: Secondary | ICD-10-CM | POA: Insufficient documentation

## 2021-03-17 DIAGNOSIS — R339 Retention of urine, unspecified: Secondary | ICD-10-CM | POA: Diagnosis not present

## 2021-03-17 DIAGNOSIS — Z794 Long term (current) use of insulin: Secondary | ICD-10-CM | POA: Diagnosis not present

## 2021-03-17 LAB — URINALYSIS, MICROSCOPIC (REFLEX)

## 2021-03-17 LAB — CBC WITH DIFFERENTIAL/PLATELET
Abs Immature Granulocytes: 0.04 10*3/uL (ref 0.00–0.07)
Basophils Absolute: 0.1 10*3/uL (ref 0.0–0.1)
Basophils Relative: 1 %
Eosinophils Absolute: 0.1 10*3/uL (ref 0.0–0.5)
Eosinophils Relative: 2 %
HCT: 45.5 % (ref 39.0–52.0)
Hemoglobin: 15.4 g/dL (ref 13.0–17.0)
Immature Granulocytes: 1 %
Lymphocytes Relative: 15 %
Lymphs Abs: 1.1 10*3/uL (ref 0.7–4.0)
MCH: 32.2 pg (ref 26.0–34.0)
MCHC: 33.8 g/dL (ref 30.0–36.0)
MCV: 95.2 fL (ref 80.0–100.0)
Monocytes Absolute: 0.7 10*3/uL (ref 0.1–1.0)
Monocytes Relative: 10 %
Neutro Abs: 5.3 10*3/uL (ref 1.7–7.7)
Neutrophils Relative %: 71 %
Platelets: 226 10*3/uL (ref 150–400)
RBC: 4.78 MIL/uL (ref 4.22–5.81)
RDW: 14.1 % (ref 11.5–15.5)
WBC: 7.3 10*3/uL (ref 4.0–10.5)
nRBC: 0 % (ref 0.0–0.2)

## 2021-03-17 LAB — URINALYSIS, ROUTINE W REFLEX MICROSCOPIC
Bilirubin Urine: NEGATIVE
Glucose, UA: NEGATIVE mg/dL
Ketones, ur: NEGATIVE mg/dL
Leukocytes,Ua: NEGATIVE
Nitrite: NEGATIVE
Protein, ur: NEGATIVE mg/dL
Specific Gravity, Urine: 1.015 (ref 1.005–1.030)
pH: 5.5 (ref 5.0–8.0)

## 2021-03-17 LAB — BASIC METABOLIC PANEL
Anion gap: 10 (ref 5–15)
BUN: 55 mg/dL — ABNORMAL HIGH (ref 8–23)
CO2: 29 mmol/L (ref 22–32)
Calcium: 9.8 mg/dL (ref 8.9–10.3)
Chloride: 97 mmol/L — ABNORMAL LOW (ref 98–111)
Creatinine, Ser: 2.4 mg/dL — ABNORMAL HIGH (ref 0.61–1.24)
GFR, Estimated: 27 mL/min — ABNORMAL LOW (ref >60)
Glucose, Bld: 191 mg/dL — ABNORMAL HIGH (ref 70–99)
Potassium: 3.5 mmol/L (ref 3.5–5.1)
Sodium: 136 mmol/L (ref 135–145)

## 2021-03-17 LAB — CBG MONITORING, ED: Glucose-Capillary: 60 mg/dL — ABNORMAL LOW (ref 70–99)

## 2021-03-17 NOTE — ED Provider Notes (Signed)
[facility] EMERGENCY DEPARTMENT Provider Note   CSN: [id number] Arrival date & time: 03/17/21  1201     History Chief Complaint  Patient presents with   Urinary Retention    Craig Black is a 75 y.o. male with history of chronic kidney disease secondary to diabetes mellitus who presents the emergency department with decreased urinary output since last night.  Last urinated this morning.  He denies any dysuria, urinary frequency, urinary urgency, or hematuria.  Patient attempted to talk with his nephrologist yesterday but never called him back.  Patient also denies cough, congestion abdominal pain, nausea, vomiting, diarrhea.  HPI     Past Medical History:  Diagnosis Date   Arthritis    Chronic kidney disease    Chronic sinusitis    COPD (chronic obstructive pulmonary disease) (HCC)    Diabetes mellitus    Gout    Hard of hearing    Right ear   Hyperchloremia    Hypertension    Murmur    Neuropathy in diabetes ([place])    Obesity    OSA (obstructive sleep apnea)    Paroxysmal A-fib ([place])    Tobacco abuse     Patient Active Problem List   Diagnosis Date Noted   Nephrolithiasis 12/08/2019   Diabetes mellitus 06/26/2011   Acute renal failure ([place]) 06/26/2011   Hypertension    Chronic sinusitis    OSA (obstructive sleep apnea)    Paroxysmal A-fib (HCC)    Neuropathy in diabetes (HCC)    COPD (chronic obstructive pulmonary disease) (HCC)    Tobacco abuse    Hard of hearing    Obesity     Past Surgical History:  Procedure Laterality Date   APPENDECTOMY     cataract     right lens replacement   EXTRACORPOREAL SHOCK WAVE LITHOTRIPSY Left 12/21/2019   Procedure: EXTRACORPOREAL SHOCK WAVE LITHOTRIPSY (ESWL);  Surgeon: Cleon Gustin, MD;  Location: [facility];  Service: Urology;  Laterality: Left;   HERNIA REPAIR     SINUS EXPLORATION     1998       Family History  Adopted: Yes    Social History   Tobacco Use   Smoking status: Every Day    Types:  Cigars   Smokeless tobacco: Never   Tobacco comments:    4 cigars  Vaping Use   Vaping Use: Never used  Substance Use Topics   Alcohol use: No   Drug use: No    Home Medications Prior to Admission medications   Medication Sig Start Date End Date Taking? Authorizing Provider  albuterol (VENTOLIN HFA) 108 (90 Base) MCG/ACT inhaler Inhale 2 puffs into the lungs every 6 (six) hours as needed for wheezing or shortness of breath.   Yes [provider]  amLODipine (NORVASC) 10 MG tablet Take 10 mg by mouth daily. 03/17/21  Yes [provider]  apixaban (ELIQUIS) 5 MG TABS tablet Take 5 mg by mouth 2 (two) times daily.   Yes [provider]  calcitRIOL (ROCALTROL) 0.25 MCG capsule Take 0.25 mcg by mouth 3 (three) times a week. 02/07/21  Yes [provider]  carvedilol (COREG) 3.125 MG tablet Take 3.125 mg by mouth 2 (two) times daily with a meal.  11/11/19  Yes [provider]  furosemide (LASIX) 20 MG tablet Take 40 mg by mouth daily. 03/13/21  Yes [provider]  hydrochlorothiazide (HYDRODIURIL) 25 MG tablet Take 25 mg by mouth daily. 02/14/21  Yes [provider]  insulin aspart protamine- aspart (NOVOLOG MIX 70/30) (70-30) 100 UNIT/ML injection Inject 55 Units into the skin in the morning, at noon, in the evening, and at bedtime.   Yes [provider]  loratadine (CLARITIN) 10 MG tablet Take 10 mg by mouth daily.   Yes [provider]  losartan (COZAAR) 25 MG tablet Take 12.5 mg by mouth daily. 02/06/21  Yes [provider]  rosuvastatin (CRESTOR) 40 MG tablet Take 40 mg by mouth daily.    Yes [provider]  TRELEGY ELLIPTA 100-62.5-25 MCG/ACT AEPB Inhale 1 puff into the lungs daily. 03/11/21  Yes [provider]  ondansetron (ZOFRAN ODT) 4 MG disintegrating tablet Take 1 tablet (4 mg total) by mouth every 8 (eight) hours as needed for nausea or vomiting. Patient not taking: Reported on  03/17/2021 12/21/19   Cleon Gustin, MD  tamsulosin (FLOMAX) 0.4 MG CAPS capsule Take 1 capsule (0.4 mg total) by mouth daily after supper. Patient not taking: Reported on 03/17/2021 12/21/19   Cleon Gustin, MD    Allergies    Patient has no known allergies.  Review of Systems   Review of Systems  All other systems reviewed and are negative.  Physical Exam Updated Vital Signs BP (!) 130/58   Pulse 77   Temp 98.5 F (36.9 C) (Oral)   Resp (!) 22   SpO2 97%   Physical Exam Vitals and nursing note reviewed.  Constitutional:      General: He is not in acute distress.    Appearance: Normal appearance.  HENT:     Head: Normocephalic and atraumatic.  Eyes:     General:        Right eye: No discharge.        Left eye: No discharge.  Cardiovascular:     Comments: Regular rate and rhythm.  S1/S2 are distinct without any evidence of murmur, rubs, or gallops.  Radial pulses are 2+ bilaterally.  Dorsalis pedis pulses are 2+ bilaterally.  No evidence of pedal edema. Pulmonary:     Comments: Clear to auscultation bilaterally.  Normal effort.  No respiratory distress.  No evidence of wheezes, rales, or rhonchi heard throughout. Abdominal:     General: Abdomen is flat. Bowel sounds are normal. There is no distension.     Tenderness: There is no abdominal tenderness. There is no right CVA tenderness, left CVA tenderness, guarding or rebound.  Musculoskeletal:        General: Normal range of motion.     Cervical back: Neck supple.  Skin:    General: Skin is warm and dry.     Findings: No rash.  Neurological:     General: No focal deficit present.     Mental Status: He is alert.  Psychiatric:        Mood and Affect: Mood normal.        Behavior: Behavior normal.    ED Results / Procedures / Treatments   Labs (all labs ordered are listed, but only abnormal results are displayed) Labs Reviewed  BASIC METABOLIC PANEL - Abnormal; Notable for the following components:       Result Value   Chloride 97 (*)    Glucose, Bld 191 (*)    BUN 55 (*)    Creatinine, Ser 2.40 (*)    GFR, Estimated 27 (*)    All other components within normal limits  URINALYSIS, ROUTINE W REFLEX MICROSCOPIC - Abnormal; Notable for the following components:   Hgb urine  dipstick TRACE (*)    All other components within normal limits  URINALYSIS, MICROSCOPIC (REFLEX) - Abnormal; Notable for the following components:   Bacteria, UA RARE (*)    All other components within normal limits  CBG MONITORING, ED - Abnormal; Notable for the following components:   Glucose-Capillary 60 (*)    All other components within normal limits  CBC WITH DIFFERENTIAL/PLATELET    EKG None  Radiology No results found.  Procedures Procedures   Medications Ordered in ED Medications - No data to display  ED Course  I have reviewed the triage vital signs and the nursing notes.  Pertinent labs & imaging results that were available during my care of the patient were reviewed by me and considered in my medical decision making (see chart for details).  Clinical Course as of 03/17/21 1846  Sat Mar 17, 2021  1336 I discussed this case with my attending physician who cosigned this note including patient's presenting symptoms, physical exam, and planned diagnostics and interventions. Attending physician stated agreement with plan or made changes to plan which were implemented.    [CF]  2778 Patient has 425 mL in his bladder on bladder scan has a strong urge to urinate but is unable to.  We will place Foley. [CF]    Clinical Course User Index [CF] Cherrie Gauze   MDM Rules/Calculators/A&P                          Craig Black is a 75 y.o. male who presents the emergency department with decreased urinary output.  Patient is resting comfortably in the emergency department is in no acute distress.  Vital signs are completely normal.  He is a completely normal physical exam at this time.  I will  obtain some basic labs to check kidney function and electrolytes.  I will also obtain a bladder scan to see if he is in acute retention.  CBC was without any significant abnormality.  BMP shows chronic ongoing kidney disease but improved from previous result.  UA was negative for any urinary tract infection.  Patient did have an episode of hypoglycemia.  He was given some food and his symptoms improved.  Patient also did have the urge to urinate.  We attempted to place a Foley catheter but patient declined.  Patient had urinary output of approximately 350 mL on his own.  Given the clinical scenario, I believe he is safe for outpatient follow-up.  I will have him follow-up with his nephrologist in the next week or so.  All questions or concerns addressed.  He is safe for discharge.   Final Clinical Impression(s) / ED Diagnoses Final diagnoses:  Urinary retention    Rx / DC Orders ED Discharge Orders     None        Cherrie Gauze 03/17/21 1846    Davonna Belling, MD 03/17/21 [id number]

## 2021-03-17 NOTE — ED Triage Notes (Signed)
Unable to urinate

## 2021-03-17 NOTE — ED Notes (Signed)
RN went into room to insert foley catheter due to urinary retention. Pt reported he did not need the catheter, he just wanted "something to put it and pee". Pt given urinal per his request. Primary RN made aware of pt's request.

## 2021-03-17 NOTE — Discharge Instructions (Signed)
Your kidney function is improved from previous lab studies.  Your urinalysis was negative for any infection and did not show any protein.  I would like for you to follow-up with your nephrologist in the next week to ensure we are going the right direction.  Please return to the emergency department sooner if you experience trouble urinating, unable to urinate, pain when you go pee, burning when you go pee, abdominal pain, or any concerns you might have.

## 2021-03-22 DIAGNOSIS — R809 Proteinuria, unspecified: Secondary | ICD-10-CM | POA: Diagnosis not present

## 2021-03-22 DIAGNOSIS — E1129 Type 2 diabetes mellitus with other diabetic kidney complication: Secondary | ICD-10-CM | POA: Diagnosis not present

## 2021-03-22 DIAGNOSIS — N189 Chronic kidney disease, unspecified: Secondary | ICD-10-CM | POA: Diagnosis not present

## 2021-03-22 DIAGNOSIS — I129 Hypertensive chronic kidney disease with stage 1 through stage 4 chronic kidney disease, or unspecified chronic kidney disease: Secondary | ICD-10-CM | POA: Diagnosis not present

## 2021-03-22 DIAGNOSIS — E1122 Type 2 diabetes mellitus with diabetic chronic kidney disease: Secondary | ICD-10-CM | POA: Diagnosis not present

## 2021-03-22 DIAGNOSIS — E211 Secondary hyperparathyroidism, not elsewhere classified: Secondary | ICD-10-CM | POA: Diagnosis not present

## 2021-03-29 ENCOUNTER — Other Ambulatory Visit (HOSPITAL_COMMUNITY): Payer: Self-pay | Admitting: Nephrology

## 2021-03-29 ENCOUNTER — Other Ambulatory Visit: Payer: Self-pay | Admitting: Nephrology

## 2021-03-29 DIAGNOSIS — N170 Acute kidney failure with tubular necrosis: Secondary | ICD-10-CM

## 2021-03-29 DIAGNOSIS — E1122 Type 2 diabetes mellitus with diabetic chronic kidney disease: Secondary | ICD-10-CM

## 2021-03-29 DIAGNOSIS — N189 Chronic kidney disease, unspecified: Secondary | ICD-10-CM | POA: Diagnosis not present

## 2021-03-29 DIAGNOSIS — E1129 Type 2 diabetes mellitus with other diabetic kidney complication: Secondary | ICD-10-CM

## 2021-03-29 DIAGNOSIS — R809 Proteinuria, unspecified: Secondary | ICD-10-CM | POA: Diagnosis not present

## 2021-03-29 DIAGNOSIS — I129 Hypertensive chronic kidney disease with stage 1 through stage 4 chronic kidney disease, or unspecified chronic kidney disease: Secondary | ICD-10-CM | POA: Diagnosis not present

## 2021-03-29 DIAGNOSIS — R339 Retention of urine, unspecified: Secondary | ICD-10-CM | POA: Diagnosis not present

## 2021-03-29 DIAGNOSIS — E211 Secondary hyperparathyroidism, not elsewhere classified: Secondary | ICD-10-CM | POA: Diagnosis not present

## 2021-03-29 DIAGNOSIS — K5909 Other constipation: Secondary | ICD-10-CM | POA: Diagnosis not present

## 2021-04-06 ENCOUNTER — Ambulatory Visit (HOSPITAL_COMMUNITY): Admission: RE | Admit: 2021-04-06 | Payer: Medicare HMO | Source: Ambulatory Visit

## 2021-04-06 ENCOUNTER — Encounter (HOSPITAL_COMMUNITY): Payer: Self-pay

## 2021-04-11 DIAGNOSIS — E1165 Type 2 diabetes mellitus with hyperglycemia: Secondary | ICD-10-CM | POA: Diagnosis not present

## 2021-04-11 DIAGNOSIS — I10 Essential (primary) hypertension: Secondary | ICD-10-CM | POA: Diagnosis not present

## 2021-04-19 ENCOUNTER — Ambulatory Visit (HOSPITAL_COMMUNITY)
Admission: RE | Admit: 2021-04-19 | Discharge: 2021-04-19 | Disposition: A | Discharge status: Home / Self Care | Payer: Medicare HMO | Source: Ambulatory Visit | Attending: Nephrology | Admitting: Nephrology

## 2021-04-19 ENCOUNTER — Other Ambulatory Visit: Payer: Self-pay

## 2021-04-19 DIAGNOSIS — N170 Acute kidney failure with tubular necrosis: Secondary | ICD-10-CM | POA: Diagnosis not present

## 2021-04-19 DIAGNOSIS — E1122 Type 2 diabetes mellitus with diabetic chronic kidney disease: Secondary | ICD-10-CM

## 2021-04-19 DIAGNOSIS — E1129 Type 2 diabetes mellitus with other diabetic kidney complication: Secondary | ICD-10-CM

## 2021-04-19 DIAGNOSIS — R809 Proteinuria, unspecified: Secondary | ICD-10-CM | POA: Diagnosis not present

## 2021-04-19 DIAGNOSIS — I129 Hypertensive chronic kidney disease with stage 1 through stage 4 chronic kidney disease, or unspecified chronic kidney disease: Secondary | ICD-10-CM | POA: Diagnosis not present

## 2021-04-19 DIAGNOSIS — N179 Acute kidney failure, unspecified: Secondary | ICD-10-CM | POA: Diagnosis not present

## 2021-04-19 DIAGNOSIS — N189 Chronic kidney disease, unspecified: Secondary | ICD-10-CM | POA: Diagnosis not present

## 2021-04-19 DIAGNOSIS — E211 Secondary hyperparathyroidism, not elsewhere classified: Secondary | ICD-10-CM | POA: Diagnosis not present

## 2021-04-20 DIAGNOSIS — E1129 Type 2 diabetes mellitus with other diabetic kidney complication: Secondary | ICD-10-CM | POA: Diagnosis not present

## 2021-04-20 DIAGNOSIS — E1122 Type 2 diabetes mellitus with diabetic chronic kidney disease: Secondary | ICD-10-CM | POA: Diagnosis not present

## 2021-04-20 DIAGNOSIS — I129 Hypertensive chronic kidney disease with stage 1 through stage 4 chronic kidney disease, or unspecified chronic kidney disease: Secondary | ICD-10-CM | POA: Diagnosis not present

## 2021-04-20 DIAGNOSIS — N170 Acute kidney failure with tubular necrosis: Secondary | ICD-10-CM | POA: Diagnosis not present

## 2021-04-20 DIAGNOSIS — R809 Proteinuria, unspecified: Secondary | ICD-10-CM | POA: Diagnosis not present

## 2021-04-20 DIAGNOSIS — E211 Secondary hyperparathyroidism, not elsewhere classified: Secondary | ICD-10-CM | POA: Diagnosis not present

## 2021-04-20 DIAGNOSIS — N189 Chronic kidney disease, unspecified: Secondary | ICD-10-CM | POA: Diagnosis not present

## 2021-05-12 DIAGNOSIS — E1165 Type 2 diabetes mellitus with hyperglycemia: Secondary | ICD-10-CM | POA: Diagnosis not present

## 2021-05-12 DIAGNOSIS — I10 Essential (primary) hypertension: Secondary | ICD-10-CM | POA: Diagnosis not present

## 2021-05-21 ENCOUNTER — Other Ambulatory Visit: Payer: Self-pay

## 2021-05-21 ENCOUNTER — Emergency Department (HOSPITAL_COMMUNITY): Payer: Medicare HMO

## 2021-05-21 ENCOUNTER — Encounter (HOSPITAL_COMMUNITY): Payer: Self-pay | Admitting: *Deleted

## 2021-05-21 ENCOUNTER — Inpatient Hospital Stay (HOSPITAL_COMMUNITY)
Admission: EM | Admit: 2021-05-21 | Discharge: 2021-05-25 | DRG: 291 | Disposition: A | Discharge status: Home Health | Payer: Medicare HMO | Attending: Internal Medicine | Admitting: Internal Medicine

## 2021-05-21 DIAGNOSIS — R262 Difficulty in walking, not elsewhere classified: Secondary | ICD-10-CM

## 2021-05-21 DIAGNOSIS — Z6841 Body Mass Index (BMI) 40.0 and over, adult: Secondary | ICD-10-CM

## 2021-05-21 DIAGNOSIS — E876 Hypokalemia: Secondary | ICD-10-CM

## 2021-05-21 DIAGNOSIS — J9601 Acute respiratory failure with hypoxia: Secondary | ICD-10-CM | POA: Diagnosis present

## 2021-05-21 DIAGNOSIS — J449 Chronic obstructive pulmonary disease, unspecified: Secondary | ICD-10-CM | POA: Diagnosis not present

## 2021-05-21 DIAGNOSIS — R52 Pain, unspecified: Secondary | ICD-10-CM | POA: Diagnosis not present

## 2021-05-21 DIAGNOSIS — Z9181 History of falling: Secondary | ICD-10-CM | POA: Diagnosis not present

## 2021-05-21 DIAGNOSIS — Z20822 Contact with and (suspected) exposure to covid-19: Secondary | ICD-10-CM | POA: Diagnosis present

## 2021-05-21 DIAGNOSIS — Z91199 Patient's noncompliance with other medical treatment and regimen due to unspecified reason: Secondary | ICD-10-CM

## 2021-05-21 DIAGNOSIS — Z794 Long term (current) use of insulin: Secondary | ICD-10-CM

## 2021-05-21 DIAGNOSIS — M25461 Effusion, right knee: Secondary | ICD-10-CM | POA: Diagnosis not present

## 2021-05-21 DIAGNOSIS — J439 Emphysema, unspecified: Secondary | ICD-10-CM | POA: Diagnosis not present

## 2021-05-21 DIAGNOSIS — R0902 Hypoxemia: Secondary | ICD-10-CM | POA: Diagnosis not present

## 2021-05-21 DIAGNOSIS — R5381 Other malaise: Secondary | ICD-10-CM

## 2021-05-21 DIAGNOSIS — I4891 Unspecified atrial fibrillation: Secondary | ICD-10-CM | POA: Diagnosis not present

## 2021-05-21 DIAGNOSIS — E1140 Type 2 diabetes mellitus with diabetic neuropathy, unspecified: Secondary | ICD-10-CM | POA: Diagnosis present

## 2021-05-21 DIAGNOSIS — Z7901 Long term (current) use of anticoagulants: Secondary | ICD-10-CM

## 2021-05-21 DIAGNOSIS — N1831 Chronic kidney disease, stage 3a: Secondary | ICD-10-CM

## 2021-05-21 DIAGNOSIS — I509 Heart failure, unspecified: Secondary | ICD-10-CM

## 2021-05-21 DIAGNOSIS — F17210 Nicotine dependence, cigarettes, uncomplicated: Secondary | ICD-10-CM | POA: Diagnosis present

## 2021-05-21 DIAGNOSIS — J9811 Atelectasis: Secondary | ICD-10-CM | POA: Diagnosis not present

## 2021-05-21 DIAGNOSIS — I130 Hypertensive heart and chronic kidney disease with heart failure and stage 1 through stage 4 chronic kidney disease, or unspecified chronic kidney disease: Secondary | ICD-10-CM | POA: Diagnosis not present

## 2021-05-21 DIAGNOSIS — N183 Chronic kidney disease, stage 3 unspecified: Secondary | ICD-10-CM | POA: Diagnosis present

## 2021-05-21 DIAGNOSIS — M25561 Pain in right knee: Secondary | ICD-10-CM

## 2021-05-21 DIAGNOSIS — E669 Obesity, unspecified: Secondary | ICD-10-CM | POA: Diagnosis present

## 2021-05-21 DIAGNOSIS — I480 Paroxysmal atrial fibrillation: Secondary | ICD-10-CM | POA: Diagnosis not present

## 2021-05-21 DIAGNOSIS — I10 Essential (primary) hypertension: Secondary | ICD-10-CM | POA: Diagnosis present

## 2021-05-21 DIAGNOSIS — I5033 Acute on chronic diastolic (congestive) heart failure: Secondary | ICD-10-CM | POA: Diagnosis present

## 2021-05-21 DIAGNOSIS — R29898 Other symptoms and signs involving the musculoskeletal system: Secondary | ICD-10-CM

## 2021-05-21 DIAGNOSIS — S0990XA Unspecified injury of head, initial encounter: Secondary | ICD-10-CM | POA: Diagnosis not present

## 2021-05-21 DIAGNOSIS — R531 Weakness: Secondary | ICD-10-CM | POA: Diagnosis not present

## 2021-05-21 DIAGNOSIS — E119 Type 2 diabetes mellitus without complications: Secondary | ICD-10-CM

## 2021-05-21 DIAGNOSIS — I5031 Acute diastolic (congestive) heart failure: Secondary | ICD-10-CM | POA: Diagnosis not present

## 2021-05-21 DIAGNOSIS — Z720 Tobacco use: Secondary | ICD-10-CM | POA: Diagnosis present

## 2021-05-21 DIAGNOSIS — Z79899 Other long term (current) drug therapy: Secondary | ICD-10-CM | POA: Diagnosis not present

## 2021-05-21 DIAGNOSIS — E1122 Type 2 diabetes mellitus with diabetic chronic kidney disease: Secondary | ICD-10-CM | POA: Diagnosis present

## 2021-05-21 DIAGNOSIS — G319 Degenerative disease of nervous system, unspecified: Secondary | ICD-10-CM | POA: Diagnosis not present

## 2021-05-21 DIAGNOSIS — Y92009 Unspecified place in unspecified non-institutional (private) residence as the place of occurrence of the external cause: Secondary | ICD-10-CM | POA: Diagnosis not present

## 2021-05-21 DIAGNOSIS — Z7401 Bed confinement status: Secondary | ICD-10-CM | POA: Diagnosis not present

## 2021-05-21 DIAGNOSIS — W19XXXA Unspecified fall, initial encounter: Secondary | ICD-10-CM | POA: Diagnosis present

## 2021-05-21 DIAGNOSIS — J90 Pleural effusion, not elsewhere classified: Secondary | ICD-10-CM | POA: Diagnosis not present

## 2021-05-21 DIAGNOSIS — G4733 Obstructive sleep apnea (adult) (pediatric): Secondary | ICD-10-CM | POA: Diagnosis not present

## 2021-05-21 DIAGNOSIS — E66813 Obesity, class 3: Secondary | ICD-10-CM

## 2021-05-21 LAB — CBC WITH DIFFERENTIAL/PLATELET
Abs Immature Granulocytes: 0.01 10*3/uL (ref 0.00–0.07)
Basophils Absolute: 0 10*3/uL (ref 0.0–0.1)
Basophils Relative: 1 %
Eosinophils Absolute: 0 10*3/uL (ref 0.0–0.5)
Eosinophils Relative: 0 %
HCT: 48.5 % (ref 39.0–52.0)
Hemoglobin: 15.3 g/dL (ref 13.0–17.0)
Immature Granulocytes: 0 %
Lymphocytes Relative: 8 %
Lymphs Abs: 0.7 10*3/uL (ref 0.7–4.0)
MCH: 31.2 pg (ref 26.0–34.0)
MCHC: 31.5 g/dL (ref 30.0–36.0)
MCV: 99 fL (ref 80.0–100.0)
Monocytes Absolute: 0.6 10*3/uL (ref 0.1–1.0)
Monocytes Relative: 8 %
Neutro Abs: 6.6 10*3/uL (ref 1.7–7.7)
Neutrophils Relative %: 83 %
Platelets: 247 10*3/uL (ref 150–400)
RBC: 4.9 MIL/uL (ref 4.22–5.81)
RDW: 16.6 % — ABNORMAL HIGH (ref 11.5–15.5)
WBC: 8 10*3/uL (ref 4.0–10.5)
nRBC: 0 % (ref 0.0–0.2)

## 2021-05-21 LAB — COMPREHENSIVE METABOLIC PANEL
ALT: 11 U/L (ref 0–44)
AST: 14 U/L — ABNORMAL LOW (ref 15–41)
Albumin: 3.4 g/dL — ABNORMAL LOW (ref 3.5–5.0)
Alkaline Phosphatase: 78 U/L (ref 38–126)
Anion gap: 12 (ref 5–15)
BUN: 29 mg/dL — ABNORMAL HIGH (ref 8–23)
CO2: 28 mmol/L (ref 22–32)
Calcium: 9.1 mg/dL (ref 8.9–10.3)
Chloride: 98 mmol/L (ref 98–111)
Creatinine, Ser: 1.48 mg/dL — ABNORMAL HIGH (ref 0.61–1.24)
GFR, Estimated: 49 mL/min — ABNORMAL LOW (ref >60)
Glucose, Bld: 216 mg/dL — ABNORMAL HIGH (ref 70–99)
Potassium: 3.8 mmol/L (ref 3.5–5.1)
Sodium: 138 mmol/L (ref 135–145)
Total Bilirubin: 1 mg/dL (ref 0.3–1.2)
Total Protein: 7.2 g/dL (ref 6.5–8.1)

## 2021-05-21 LAB — MAGNESIUM: Magnesium: 1.9 mg/dL (ref 1.7–2.4)

## 2021-05-21 LAB — BLOOD GAS, VENOUS
Acid-Base Excess: 5.2 mmol/L — ABNORMAL HIGH (ref 0.0–2.0)
Bicarbonate: 27.7 mmol/L (ref 20.0–28.0)
Drawn by: 6352
FIO2: 32
O2 Saturation: 76 %
Patient temperature: 36.8
pCO2, Ven: 49.5 mmHg (ref 44.0–60.0)
pH, Ven: 7.397 (ref 7.250–7.430)
pO2, Ven: 44.4 mmHg (ref 32.0–45.0)

## 2021-05-21 LAB — LIPASE, BLOOD: Lipase: 20 U/L (ref 11–51)

## 2021-05-21 LAB — TSH: TSH: 1.229 u[IU]/mL (ref 0.350–4.500)

## 2021-05-21 LAB — RESP PANEL BY RT-PCR (FLU A&B, COVID) ARPGX2
Influenza A by PCR: NEGATIVE
Influenza B by PCR: NEGATIVE
SARS Coronavirus 2 by RT PCR: NEGATIVE

## 2021-05-21 LAB — PROTIME-INR
INR: 1 (ref 0.8–1.2)
Prothrombin Time: 13.5 seconds (ref 11.4–15.2)

## 2021-05-21 LAB — CK: Total CK: 79 U/L (ref 49–397)

## 2021-05-21 LAB — TROPONIN I (HIGH SENSITIVITY)
Troponin I (High Sensitivity): 16 ng/L (ref <18)
Troponin I (High Sensitivity): 15 ng/L (ref <18)

## 2021-05-21 LAB — AMMONIA: Ammonia: 15 umol/L (ref 9–35)

## 2021-05-21 LAB — CBG MONITORING, ED: Glucose-Capillary: 173 mg/dL — ABNORMAL HIGH (ref 70–99)

## 2021-05-21 MED ORDER — IPRATROPIUM-ALBUTEROL 0.5-2.5 (3) MG/3ML IN SOLN
3.0000 mL | Freq: Once | RESPIRATORY_TRACT | Status: AC
  Administered 2021-05-21: 3 mL via RESPIRATORY_TRACT
  Filled 2021-05-21: qty 3

## 2021-05-21 MED ORDER — INSULIN ASPART PROT & ASPART (70-30 MIX) 100 UNIT/ML ~~LOC~~ SUSP
20.0000 [IU] | Freq: Two times a day (BID) | SUBCUTANEOUS | Status: DC
  Filled 2021-05-21 (×2): qty 10

## 2021-05-21 MED ORDER — APIXABAN 5 MG PO TABS
5.0000 mg | ORAL_TABLET | Freq: Two times a day (BID) | ORAL | Status: DC
  Administered 2021-05-21 – 2021-05-25 (×8): 5 mg via ORAL
  Filled 2021-05-21 (×8): qty 1

## 2021-05-21 MED ORDER — INSULIN ASPART 100 UNIT/ML IJ SOLN
0.0000 [IU] | Freq: Three times a day (TID) | INTRAMUSCULAR | Status: DC
  Administered 2021-05-22: 5 [IU] via SUBCUTANEOUS
  Administered 2021-05-22 (×2): 3 [IU] via SUBCUTANEOUS
  Administered 2021-05-23: 8 [IU] via SUBCUTANEOUS
  Administered 2021-05-23: 5 [IU] via SUBCUTANEOUS
  Administered 2021-05-23 – 2021-05-24 (×2): 3 [IU] via SUBCUTANEOUS
  Administered 2021-05-24: 5 [IU] via SUBCUTANEOUS
  Administered 2021-05-24: 8 [IU] via SUBCUTANEOUS
  Administered 2021-05-25: 3 [IU] via SUBCUTANEOUS

## 2021-05-21 MED ORDER — ACETAMINOPHEN 325 MG PO TABS: 650.0000 mg | ORAL_TABLET | Freq: Four times a day (QID) | ORAL | Status: DC | PRN

## 2021-05-21 MED ORDER — POLYETHYLENE GLYCOL 3350 17 G PO PACK: 17.0000 g | PACK | Freq: Every day | ORAL | Status: DC | PRN

## 2021-05-21 MED ORDER — ACETAMINOPHEN 650 MG RE SUPP: 650.0000 mg | Freq: Four times a day (QID) | RECTAL | Status: DC | PRN

## 2021-05-21 MED ORDER — FLUTICASONE FUROATE-VILANTEROL 100-25 MCG/ACT IN AEPB
1.0000 | INHALATION_SPRAY | Freq: Every day | RESPIRATORY_TRACT | Status: DC
  Administered 2021-05-22 – 2021-05-25 (×4): 1 via RESPIRATORY_TRACT
  Filled 2021-05-21: qty 28

## 2021-05-21 MED ORDER — IOHEXOL 350 MG/ML SOLN
100.0000 mL | Freq: Once | INTRAVENOUS | Status: AC | PRN
  Administered 2021-05-21: 80 mL via INTRAVENOUS

## 2021-05-21 MED ORDER — ONDANSETRON HCL 4 MG/2ML IJ SOLN: 4.0000 mg | Freq: Four times a day (QID) | INTRAMUSCULAR | Status: DC | PRN

## 2021-05-21 MED ORDER — INSULIN ASPART 100 UNIT/ML IJ SOLN
0.0000 [IU] | Freq: Every day | INTRAMUSCULAR | Status: DC
  Administered 2021-05-24: 2 [IU] via SUBCUTANEOUS

## 2021-05-21 MED ORDER — CARVEDILOL 3.125 MG PO TABS
6.2500 mg | ORAL_TABLET | Freq: Two times a day (BID) | ORAL | Status: DC
  Administered 2021-05-21 – 2021-05-25 (×8): 6.25 mg via ORAL
  Filled 2021-05-21 (×8): qty 2

## 2021-05-21 MED ORDER — FUROSEMIDE 10 MG/ML IJ SOLN
40.0000 mg | Freq: Once | INTRAMUSCULAR | Status: AC
  Administered 2021-05-21: 40 mg via INTRAVENOUS
  Filled 2021-05-21: qty 4

## 2021-05-21 MED ORDER — SODIUM CHLORIDE 0.9 % IV BOLUS
500.0000 mL | Freq: Once | INTRAVENOUS | Status: AC
  Administered 2021-05-21: 500 mL via INTRAVENOUS

## 2021-05-21 MED ORDER — UMECLIDINIUM BROMIDE 62.5 MCG/ACT IN AEPB
1.0000 | INHALATION_SPRAY | Freq: Every day | RESPIRATORY_TRACT | Status: DC
  Administered 2021-05-22 – 2021-05-25 (×4): 1 via RESPIRATORY_TRACT
  Filled 2021-05-21: qty 7

## 2021-05-21 MED ORDER — ONDANSETRON HCL 4 MG PO TABS: 4.0000 mg | ORAL_TABLET | Freq: Four times a day (QID) | ORAL | Status: DC | PRN

## 2021-05-21 MED ORDER — AMLODIPINE BESYLATE 5 MG PO TABS
10.0000 mg | ORAL_TABLET | Freq: Every day | ORAL | Status: DC
  Administered 2021-05-22 – 2021-05-25 (×4): 10 mg via ORAL
  Filled 2021-05-21 (×4): qty 2

## 2021-05-21 MED ORDER — FUROSEMIDE 10 MG/ML IJ SOLN
40.0000 mg | Freq: Two times a day (BID) | INTRAMUSCULAR | Status: DC
  Administered 2021-05-22 (×2): 40 mg via INTRAVENOUS
  Filled 2021-05-21 (×3): qty 4

## 2021-05-21 NOTE — ED Triage Notes (Addendum)
Pt brought in by [facility] from home with c/o generalized weakness and SOB x 3 weeks. Pt also c/o right leg pain due to fall 2 weeks ago. EMS reports O2 sat 89% on RA, HR 92, BP 101/65.

## 2021-05-21 NOTE — ED Provider Notes (Signed)
Emergency Department Provider Note   I have reviewed the triage vital signs and the nursing notes.   HISTORY  Chief Complaint Weakness   HPI Craig Black is a 76 y.o. male with PMH of COPD (not on home O2), HTN, HLD, OSA, and PAF on Eliquis presents to the emergency department for evaluation of weakness and inability to walk.  Patient states that 3 weeks ago he fell while getting out of a car has pain in the right leg.  He was initially able to get up and walk around on the leg but has not been able to for the past several weeks.  He is basically been living on the couch.  He is unable to get up and go to the bathroom.  He has been wearing depends and his wife has been helping him to wipe and clean up after bowel movements.  He has not felt very hungry but denies vomiting or abdominal pain.  No chest pain.  No lower back pain.  No weakness or numbness in the arms or legs. No severe HA. No new medications.    Past Medical History:  Diagnosis Date   Arthritis    Chronic kidney disease    Chronic sinusitis    COPD (chronic obstructive pulmonary disease) (HCC)    Diabetes mellitus    Gout    Hard of hearing    Right ear   Hyperchloremia    Hypertension    Murmur    Neuropathy in diabetes ([place])    Obesity    OSA (obstructive sleep apnea)    Paroxysmal A-fib (HCC)    Tobacco abuse     Review of Systems  Constitutional: No fever/chills. Positive weakness.  Eyes: No visual changes. ENT: No sore throat. Cardiovascular: Denies chest pain. Respiratory: Positive shortness of breath (acute on chronic with COPD. Gastrointestinal: No abdominal pain.  No nausea, no vomiting.  No diarrhea.  No constipation. Genitourinary: Negative for dysuria. Musculoskeletal: Negative for back pain. Positive right leg weakness/pain. Skin: Negative for rash. Neurological: Negative for headaches, focal weakness or numbness.   ____________________________________________   PHYSICAL  EXAM:  VITAL SIGNS: ED Triage Vitals  Enc Vitals Group     BP 05/21/21 1633 (!) 154/54     Pulse Rate 05/21/21 1633 82     Resp 05/21/21 1633 16     Temp 05/21/21 1633 98.2 F (36.8 C)     Temp src --      SpO2 05/21/21 1628 (!) 81 %     Weight 05/21/21 1629 265 lb (120.2 kg)     Height 05/21/21 1629 5\' 6"  (1.676 m)   Constitutional: Alert and oriented. Well appearing and in no acute distress. Eyes: Conjunctivae are normal.  Head: Atraumatic. Nose: No congestion/rhinnorhea. Mouth/Throat: Mucous membranes are moist.  Neck: No stridor.  Cardiovascular: Normal rate, regular rhythm. Good peripheral circulation. Grossly normal heart sounds.   Respiratory: Normal respiratory effort.  No retractions. Lungs CTAB. Gastrointestinal: Soft and nontender. No distention.  Musculoskeletal: No lower extremity tenderness nor edema. No gross deformities of extremities. Normal passive ROM of the bilateral upper and lower extremities including right hip, knee, and ankle.  Neurologic:  Normal speech and language.  5/5 strength in the bilateral upper extremities. 4/5 strength in the RLE with preserved strength on the left. Normal sensation.  Skin:  Skin is warm, dry and intact.  No wounds to the sacrum, buttocks, feet.  Patient does have some skin irritation under his depends with  some residual stool but no ulceration.   ____________________________________________   LABS (all labs ordered are listed, but only abnormal results are displayed)  Labs Reviewed  COMPREHENSIVE METABOLIC PANEL - Abnormal; Notable for the following components:      Result Value   Glucose, Bld 216 (*)    BUN 29 (*)    Creatinine, Ser 1.48 (*)    Albumin 3.4 (*)    AST 14 (*)    GFR, Estimated 49 (*)    All other components within normal limits  CBC WITH DIFFERENTIAL/PLATELET - Abnormal; Notable for the following components:   RDW 16.6 (*)    All other components within normal limits  BLOOD GAS, VENOUS - Abnormal;  Notable for the following components:   Acid-Base Excess 5.2 (*)    All other components within normal limits  BRAIN NATRIURETIC PEPTIDE - Abnormal; Notable for the following components:   B Natriuretic Peptide 182.0 (*)    All other components within normal limits  BASIC METABOLIC PANEL - Abnormal; Notable for the following components:   Glucose, Bld 167 (*)    BUN 25 (*)    Creatinine, Ser 1.36 (*)    Calcium 8.6 (*)    GFR, Estimated 54 (*)    All other components within normal limits  GLUCOSE, CAPILLARY - Abnormal; Notable for the following components:   Glucose-Capillary 176 (*)    All other components within normal limits  GLUCOSE, CAPILLARY - Abnormal; Notable for the following components:   Glucose-Capillary 238 (*)    All other components within normal limits  BASIC METABOLIC PANEL - Abnormal; Notable for the following components:   Potassium 3.2 (*)    Glucose, Bld 136 (*)    BUN 25 (*)    Creatinine, Ser 1.37 (*)    Calcium 8.7 (*)    GFR, Estimated 53 (*)    All other components within normal limits  BRAIN NATRIURETIC PEPTIDE - Abnormal; Notable for the following components:   B Natriuretic Peptide 110.0 (*)    All other components within normal limits  GLUCOSE, CAPILLARY - Abnormal; Notable for the following components:   Glucose-Capillary 141 (*)    All other components within normal limits  GLUCOSE, CAPILLARY - Abnormal; Notable for the following components:   Glucose-Capillary 156 (*)    All other components within normal limits  GLUCOSE, CAPILLARY - Abnormal; Notable for the following components:   Glucose-Capillary 237 (*)    All other components within normal limits  GLUCOSE, CAPILLARY - Abnormal; Notable for the following components:   Glucose-Capillary 260 (*)    All other components within normal limits  BASIC METABOLIC PANEL - Abnormal; Notable for the following components:   Chloride 97 (*)    CO2 33 (*)    Glucose, Bld 193 (*)    BUN 29 (*)     Creatinine, Ser 1.63 (*)    GFR, Estimated 43 (*)    All other components within normal limits  MAGNESIUM - Abnormal; Notable for the following components:   Magnesium 1.6 (*)    All other components within normal limits  GLUCOSE, CAPILLARY - Abnormal; Notable for the following components:   Glucose-Capillary 197 (*)    All other components within normal limits  GLUCOSE, CAPILLARY - Abnormal; Notable for the following components:   Glucose-Capillary 187 (*)    All other components within normal limits  GLUCOSE, CAPILLARY - Abnormal; Notable for the following components:   Glucose-Capillary 263 (*)    All  other components within normal limits  BASIC METABOLIC PANEL - Abnormal; Notable for the following components:   Chloride 97 (*)    Glucose, Bld 200 (*)    BUN 29 (*)    Creatinine, Ser 1.55 (*)    GFR, Estimated 46 (*)    All other components within normal limits  GLUCOSE, CAPILLARY - Abnormal; Notable for the following components:   Glucose-Capillary 208 (*)    All other components within normal limits  GLUCOSE, CAPILLARY - Abnormal; Notable for the following components:   Glucose-Capillary 226 (*)    All other components within normal limits  GLUCOSE, CAPILLARY - Abnormal; Notable for the following components:   Glucose-Capillary 218 (*)    All other components within normal limits  CBG MONITORING, ED - Abnormal; Notable for the following components:   Glucose-Capillary 173 (*)    All other components within normal limits  CBG MONITORING, ED - Abnormal; Notable for the following components:   Glucose-Capillary 178 (*)    All other components within normal limits  RESP PANEL BY RT-PCR (FLU A&B, COVID) ARPGX2  LIPASE, BLOOD  PROTIME-INR  AMMONIA  TSH  CK  MAGNESIUM  MAGNESIUM  MAGNESIUM  TROPONIN I (HIGH SENSITIVITY)  TROPONIN I (HIGH SENSITIVITY)   ____________________________________________  EKG   EKG Interpretation  Date/Time:  Monday May 21 2021  16:34:55 EST Ventricular Rate:  90 PR Interval:    QRS Duration: 103 QT Interval:  410 QTC Calculation: 502 R Axis:   -31 Text Interpretation: Atrial fibrillation Left axis deviation Low voltage, precordial leads Abnormal R-wave progression, early transition Prolonged QT interval Confirmed by Nanda Quinton [phone]) on 05/21/2021 5:49:37 PM        ____________________________________________   PROCEDURES  Procedure(s) performed:   Procedures  CRITICAL CARE Performed by: Margette Fast Total critical care time: 35 minutes Critical care time was exclusive of separately billable procedures and treating other patients. Critical care was necessary to treat or prevent imminent or life-threatening deterioration. Critical care was time spent personally by me on the following activities: development of treatment plan with patient and/or surrogate as well as nursing, discussions with consultants, evaluation of patient's response to treatment, examination of patient, obtaining history from patient or surrogate, ordering and performing treatments and interventions, ordering and review of laboratory studies, ordering and review of radiographic studies, pulse oximetry and re-evaluation of patient's condition.  Nanda Quinton, MD Emergency Medicine  ____________________________________________   INITIAL IMPRESSION / ASSESSMENT AND PLAN / ED COURSE  Pertinent labs & imaging results that were available during my care of the patient were reviewed by me and considered in my medical decision making (see chart for details).   This patient is Presenting for Evaluation of AMS, which does require a range of treatment options, and is a complaint that involves a high risk of morbidity and mortality.  The Differential Diagnoses includes but is not exclusive to alcohol, illicit or prescription medications, intracranial pathology such as stroke, intracerebral hemorrhage, fever or infectious causes including  sepsis, hypoxemia, uremia, trauma, endocrine related disorders such as diabetes, hypoglycemia, thyroid-related diseases, etc.   Critical Interventions- IVF   Medications  perflutren lipid microspheres (DEFINITY) IV suspension (2 mLs Intravenous Given 05/22/21 1442)  sodium chloride 0.9 % bolus 500 mL (0 mLs Intravenous Stopped 05/21/21 1832)  iohexol (OMNIPAQUE) 350 MG/ML injection 100 mL (80 mLs Intravenous Contrast Given 05/21/21 1831)  furosemide (LASIX) injection 40 mg (40 mg Intravenous Given 05/21/21 2030)  ipratropium-albuterol (DUONEB) 0.5-2.5 (3) MG/3ML nebulizer  solution 3 mL (3 mLs Nebulization Given 05/21/21 2313)  potassium chloride SA (KLOR-CON M) CR tablet 40 mEq (40 mEq Oral Given 05/23/21 2122)  Living Better with Heart Failure Book ( Does not apply Given 05/23/21 1252)  magnesium sulfate IVPB 2 g 50 mL (0 g Intravenous Stopped 05/24/21 1100)  potassium chloride SA (KLOR-CON M) CR tablet 40 mEq (40 mEq Oral Given 05/24/21 0828)  living well with diabetes book MISC ( Does not apply Given 05/24/21 1157)    Reassessment after intervention: Symptoms not improved or worsened.    I did obtain Additional Historical Information from wife at bedside. Provides confirmation of symptoms onset and timeline.   I decided to review pertinent External Data, and in summary no recent ED visits for similar.   Clinical Laboratory Tests Ordered, included no leukocytosis.  No CO2 retention on VBG or acidosis.  COVID and flu were negative.  Creatinine of 1.48 with BUN at 29.  CK normal.  Ammonia is negative.  BNP mildly elevated.   Radiologic Tests Ordered, included CXR, CTA PE, CT head, plain films of right hip and knee. I independently interpreted the images and agree with radiology interpretation.   Cardiac Monitor Tracing which shows A fib.   Social Determinants of Health Risk patient is a smoker.   Consult complete with Hospitalist. Plan for admit.   Medical Decision Making: Summary:  Patient presents  emergency department with weakness and some shortness of breath along with fatigue after a fall.  Initially was ambulatory but has not been ambulatory for the past several weeks.  Imaging of the leg shows no acute fracture or dislocation.  No focal findings on exam to strongly suspect stroke.  CT imaging of the head interpreted as above.  Patient does have pleural effusions and small pericardial effusion on x-ray as well as CT without PE.  Plan for diuresis and will admit for further work-up. Right leg weakness noted. No MRI available at this time but TRH to coordinate this on inpatient side.   Disposition: admit  ____________________________________________  FINAL CLINICAL IMPRESSION(S) / ED DIAGNOSES  Final diagnoses:  Acute respiratory failure with hypoxia (HCC)  Pleural effusion  Right leg weakness     NEW OUTPATIENT MEDICATIONS STARTED DURING THIS VISIT:  Discharge Medication List as of 05/25/2021  9:23 AM     START taking these medications   Details  nicotine (NICODERM CQ - DOSED IN MG/24 HOURS) 14 mg/24hr patch Place 1 patch (14 mg total) onto the skin daily as needed (nicotine craving)., Starting Fri 05/25/2021, Normal        Note:  This document was prepared using Dragon voice recognition software and may include unintentional dictation errors.  Nanda Quinton, MD, [facility] Emergency Medicine    Ester Hilley, [facility], MD 05/29/21 1537

## 2021-05-21 NOTE — H&P (Signed)
History and Physical    Craig Black:[id number] DOB: 1946-02-18 DOA: 05/21/2021  PCP: Carrolyn Meiers, MD   Patient coming from: Home  I have personally briefly reviewed patient's old medical records in [place]  Chief Complaint: Difficulty breathing, leg pain  HPI: Craig Black is a 76 y.o. male with medical history significant for COPD, diabetes mellitus, obesity, atrial fibrillation on chronic anticoagulation, OSA. Patient presented to the ED via EMS with reports of generalized weakness, and difficulty breathing that started 3 weeks ago.  Spouse Is at bedside.  Patient's problem started about 3 weeks ago when he fell off of his car, on his right leg, he has since had pain in his right leg, he reports weakness also.  Because of this he has not been ambulating and has been lying in bed unable to walk.  He has been wearing depends and his wife has been helped pain to clean after bowel movements.  Reports poor oral intake over the past 3 weeks. He is unaware of any lower extremity swelling.  He denies abdominal bloating. He has had worsening of his difficulty breathing and worsening of his cough.  No chest pain.  He is on Lasix and Eliquis and reports compliance with both.  ED Course: O2 sats 81% on room air, placed on 3 - 4 L sats 92 to 96%.  Troponin 16 > 15.  Unremarkable VBG.  CK normal at 79.  Creatinine 1.4 is improved compared to prior.  COVID and influenza test negative. CTA chest negative for PE, shows moderate right and small left pleural effusion, adjacent atelectasis, trace pericardial effusion. Head CT without acute abnormality. 537mls given.  IV Lasix 40 mg x 1 given. Hospitalist admit for acute respiratory failure with hypoxia.  Review of Systems: As per HPI all other systems reviewed and negative.  Past Medical History:  Diagnosis Date   Arthritis    Chronic kidney disease    Chronic sinusitis    COPD (chronic obstructive pulmonary disease) (HCC)     Diabetes mellitus    Gout    Hard of hearing    Right ear   Hyperchloremia    Hypertension    Murmur    Neuropathy in diabetes ([place])    Obesity    OSA (obstructive sleep apnea)    Paroxysmal A-fib ([place])    Tobacco abuse     Past Surgical History:  Procedure Laterality Date   APPENDECTOMY     cataract     right lens replacement   EXTRACORPOREAL SHOCK WAVE LITHOTRIPSY Left 12/21/2019   Procedure: EXTRACORPOREAL SHOCK WAVE LITHOTRIPSY (ESWL);  Surgeon: Cleon Gustin, MD;  Location: [facility];  Service: Urology;  Laterality: Left;   [place]     reports that he has been smoking cigars. He has never used smokeless tobacco. He reports that he does not currently use alcohol. He reports that he does not use drugs.  No Known Allergies  Family History  Adopted: Yes   Prior to Admission medications   Medication Sig Start Date End Date Taking? Authorizing Provider  albuterol (VENTOLIN HFA) 108 (90 Base) MCG/ACT inhaler Inhale 2 puffs into the lungs every 6 (six) hours as needed for wheezing or shortness of breath.   Yes [provider]  amLODipine (NORVASC) 10 MG tablet Take 10 mg by mouth daily. 03/17/21  Yes [provider]  apixaban (ELIQUIS) 5 MG TABS tablet  Take 5 mg by mouth 2 (two) times daily.   Yes [provider]  calcitRIOL (ROCALTROL) 0.25 MCG capsule Take 0.25 mcg by mouth 3 (three) times a week. 02/07/21  Yes [provider]  furosemide (LASIX) 20 MG tablet Take 40 mg by mouth daily. 03/13/21  Yes [provider]  Insulin Aspart (NOVOLOG IJ) Inject 10 Units as directed 3 (three) times daily as needed (sugar). If blood sugar is high he will add the 10 units to the 55 units of novolog 70/30 55 units   Yes [provider]  insulin aspart protamine- aspart (NOVOLOG MIX 70/30) (70-30) 100 UNIT/ML injection Inject 55 Units into the skin 4 (four) times daily as needed (sugar).   Yes [provider]  loratadine (CLARITIN) 10 MG tablet Take 10 mg by mouth daily.   Yes [provider]  TRELEGY ELLIPTA 100-62.5-25 MCG/ACT AEPB Inhale 1 puff into the lungs daily. 03/11/21  Yes [provider]  carvedilol (COREG) 6.25 MG tablet Take 6.25 mg by mouth 2 (two) times daily. 04/29/21   [provider]  ondansetron (ZOFRAN ODT) 4 MG disintegrating tablet Take 1 tablet (4 mg total) by mouth every 8 (eight) hours as needed for nausea or vomiting. Patient not taking: Reported on 03/17/2021 12/21/19   Cleon Gustin, MD  tamsulosin (FLOMAX) 0.4 MG CAPS capsule Take 1 capsule (0.4 mg total) by mouth daily after supper. Patient not taking: Reported on 03/17/2021 12/21/19   Cleon Gustin, MD    Physical Exam: Vitals:   05/21/21 1800 05/21/21 1900 05/21/21 2000 05/21/21 2100  BP: (!) 171/117 (!) 154/92 (!) 152/70 (!) 147/71  Pulse: 90 83 80 70  Resp: (!) 28 (!) 24 19 18   Temp:      SpO2: 96% 92% 92% 94%  Weight:      Height:        Constitutional: NAD, calm, comfortable Vitals:   05/21/21 1800 05/21/21 1900 05/21/21 2000 05/21/21 2100  BP: (!) 171/117 (!) 154/92 (!) 152/70 (!) 147/71  Pulse: 90 83 80 70  Resp: (!) 28 (!) 24 19 18   Temp:      SpO2: 96% 92% 92% 94%  Weight:      Height:       Eyes: PERRL, lids and conjunctivae normal ENMT: Mucous membranes are moist.   Neck: normal, supple, no masses, no thyromegaly Respiratory: clear to auscultation bilaterally, no wheezing, no crackles. Normal respiratory effort. No accessory muscle use.  Cardiovascular: Regular rate and rhythm, no murmurs / rubs / gallops. 2 + pitting extremity edema, To knees bilaterally-unknown chronicity, lower extremities appear warm and well perfused Abdomen: no tenderness, no masses palpated. No hepatosplenomegaly. Bowel sounds positive.  Musculoskeletal: no clubbing / cyanosis. No joint deformity upper and lower extremities including right knee, good ROM, no contractures.  Normal muscle tone.  Skin: no rashes, lesions, ulcers. No induration Neurologic: No facial asymmetry, speech fluent without evidence of aphasia, sensation intact globally, 4+/5 strength in all extremities.Marland Kitchen  Psychiatric: Normal judgment and insight. Alert and oriented x 3. Normal mood.   Labs on Admission: I have personally reviewed following labs and imaging studies  CBC: Recent Labs  Lab 05/21/21 1645  WBC 8.0  NEUTROABS 6.6  HGB 15.3  HCT 48.5  MCV 99.0  PLT 725   Basic Metabolic Panel: Recent Labs  Lab 05/21/21 1645  NA 138  K 3.8  CL 98  CO2 28  GLUCOSE 216*  BUN 29*  CREATININE 1.48*  CALCIUM 9.1   GFR: Estimated Creatinine Clearance: 51.9 mL/min (A) (by C-G formula based on SCr of 1.48 mg/dL (H)). Liver Function Tests: Recent Labs  Lab 05/21/21 1645  AST 14*  ALT 11  ALKPHOS 78  BILITOT 1.0  PROT 7.2  ALBUMIN 3.4*   Recent Labs  Lab 05/21/21 1645  LIPASE 20   Recent Labs  Lab 05/21/21 1700  AMMONIA 15   Coagulation Profile: Recent Labs  Lab 05/21/21 1645  INR 1.0   Cardiac Enzymes: Recent Labs  Lab 05/21/21 1645  CKTOTAL 79   Thyroid Function Tests: Recent Labs    05/21/21 1645  TSH 1.229   Radiological Exams on Admission: DG Chest 1 View  Result Date: 05/21/2021 CLINICAL DATA:  Recent fall, history of COPD EXAM: CHEST  1 VIEW COMPARISON:  Chest radiograph 03/12/2021 FINDINGS: Mildly enlarged cardiac silhouette, likely accentuated by technique. Layering moderate-sized right and small left pleural effusions with adjacent atelectasis. No pneumothorax. No acute osseous abnormality. IMPRESSION: Moderate right and small left pleural effusions with adjacent atelectasis. Electronically Signed   By: Maurine Simmering M.D.   On: 05/21/2021 18:39   CT HEAD WO CONTRAST (5MM)  Result Date: 05/21/2021 CLINICAL DATA:  Head trauma, moderate-severe EXAM: CT HEAD WITHOUT CONTRAST TECHNIQUE: Contiguous axial images were obtained from the base of the skull  through the vertex without intravenous contrast. RADIATION DOSE REDUCTION: This exam was performed according to the departmental dose-optimization program which includes automated exposure control, adjustment of the mA and/or kV according to patient size and/or use of iterative reconstruction technique. COMPARISON:  CT 11/14/2017 FINDINGS: Brain: No evidence of acute intracranial hemorrhage or extra-axial collection.No evidence of mass lesion/concern mass effect.The ventricles are normal in size.Scattered subcortical and periventricular white matter hypodensities, nonspecific but likely sequela of chronic small vessel ischemic disease.Mild cerebral atrophy Vascular: No hyperdense vessel or unexpected calcification. Skull: Normal. Negative for fracture or focal lesion. Sinuses/Orbits: No acute finding. Other: Right mastoid effusion. IMPRESSION: No acute intracranial abnormality.  Right mastoid effusion. Mild sequela of chronic small vessel ischemic disease. Electronically Signed   By: Maurine Simmering M.D.   On: 05/21/2021 18:43   CT Angio Chest PE W and/or Wo Contrast  Result Date: 05/21/2021 CLINICAL DATA:  Pulmonary embolism (PE) suspected, high prob EXAM: CT ANGIOGRAPHY CHEST WITH CONTRAST TECHNIQUE: Multidetector CT imaging of the chest was performed using the standard protocol during bolus administration of intravenous contrast. Multiplanar CT image reconstructions and MIPs were obtained to evaluate the vascular anatomy. RADIATION DOSE REDUCTION: This exam was performed according to the departmental dose-optimization program which includes automated exposure control, adjustment of the mA and/or kV according to patient size and/or use of iterative reconstruction technique. CONTRAST:  71mL OMNIPAQUE IOHEXOL 350 MG/ML SOLN COMPARISON:  Same day radiograph FINDINGS: Cardiovascular: Normal cardiac size.Trace pericardial effusion. Coronary artery calcifications.No evidence of pulmonary embolism. Normal size main and  branch pulmonary arteries.Moderate atherosclerotic calcifications of the aortic arch and descending aorta. Common origin of the brachiocephalic and left common carotid arteries. Mediastinum/Nodes: There are few prominent mediastinal lymph nodes, likely reactive.No axillary adenopathy.The thyroid is unremarkable.Esophagus is unremarkable. Lungs/Pleura: The central airways are patent. There is mild bronchial wall thickening.Moderate right and small left pleural effusions with adjacent atelectasis.No suspicious pulmonary nodules or masses. Mild centrilobular emphysema, mid apical predominant. There is no pneumothorax.No visible suspicious pulmonary nodule or mass. Upper Abdomen: No acute abnormality. Musculoskeletal: No acute osseous abnormality.No suspicious lytic or blastic lesions. Multilevel degenerative changes of the spine. Review of the MIP  images confirms the above findings. IMPRESSION: Moderate right and small left pleural effusions with adjacent atelectasis. Trace pericardial effusion. No evidence of pulmonary embolism. Aortic Atherosclerosis (ICD10-I70.0) and Emphysema (ICD10-J43.9). Electronically Signed   By: Maurine Simmering M.D.   On: 05/21/2021 18:48   DG Hip Unilat W or Wo Pelvis 2-3 Views Right  Result Date: 05/21/2021 CLINICAL DATA:  Weakness, recent fall EXAM: DG HIP (WITH OR WITHOUT PELVIS) 2-3V RIGHT COMPARISON:  None. FINDINGS: There is no evidence of acute fracture. There is mild bilateral hip osteoarthritis. Pelvic phleboliths. Vascular calcifications. IMPRESSION: No evidence of acute right hip fracture. Electronically Signed   By: Maurine Simmering M.D.   On: 05/21/2021 18:40    EKG: Independently reviewed.  Atrial fibrillation rate 90, QTc 502.  No Significant ST or T wave changes from prior.  Assessment/Plan Principal Problem:   Acute respiratory failure with hypoxia (HCC) Active Problems:   Acute on chronic diastolic CHF (congestive heart failure) (HCC)   OSA (obstructive sleep apnea)    Paroxysmal A-fib (HCC)   COPD (chronic obstructive pulmonary disease) (HCC)   Tobacco abuse   Obesity  Acute hypoxic respiratory failure 2/2 acute on chronic diastolic MRA-1+ pitting lower extremity swelling, O2 sats down to 81% on room air, currently on 3 to 4 L sats > 92 %.  CTA chest showing moderate right and small left pleural effusion.  Trace pericardial effusion.  No PE. Last echo 2021 EF 70 to 75% indeterminate LV diastolic parameters.  Reports compliance with Eliquis and Lasix. - Trops 16 > 15. -IV Lasix 40 twice daily - Strict input output, daily weights, daily BMP -Obtain updated echocardiogram -Resume carvedilol -Check BNP, mag  Fall, inability to walk-reports knee pain and weakness x 3 weeks, on exam no subjective findings including right knee,, no focal neurologic deficits.  Head CT without acute abnormality, pelvic x-ray unremarkable. -Re-evaluate prior to discharge  CKD 3A- creatinine 1.48.  Improved compared to prior. -Monitor with diuresis  Atrial fibrillation on chronic anticoagulation- rate controlled -Resume carvedilol, Eliquis  COPD, Tobacco abuse-stable.  Tells me he smokes 3 cigarettes daily. -DuoNebs as needed -Resume home bronchodilator regimen  OSA-reports is not on CPAP or BiPAP.  Diabetes mellitus random glucose 216.  Has not been compliant with his insulins.  Reports poor oral intake. -Resume home NPH at reduced dose 20 units BID - HgbA1c - SSI- M  Hypertension -Resume Norvasc, carvedilol, tamsulosin  DVT prophylaxis: Eliquis Code Status: Full code Family Communication: Spouse at bedside Disposition Plan: ~ 2 days Consults called: none Admission status: inpt, tele I certify that at the point of admission it is my clinical judgment that the patient will require inpatient hospital care spanning beyond 2 midnights from the point of admission due to high intensity of service, high risk for further deterioration and high frequency of surveillance  required.     Bethena Roys MD [facility]  05/21/2021, 11:11 PM

## 2021-05-21 NOTE — ED Notes (Signed)
Pt attached to [place]. New brief given and bed linens change. Pt given pillow and repositioned.

## 2021-05-21 NOTE — ED Notes (Signed)
Patient had feces on bottom and legs. Patient cleaned and provided with new brief, chuck pad, and warm blankets at this time.

## 2021-05-21 NOTE — ED Notes (Signed)
Patient changed and had one bowel movement at this time.

## 2021-05-22 ENCOUNTER — Observation Stay (HOSPITAL_COMMUNITY): Payer: Medicare HMO

## 2021-05-22 ENCOUNTER — Other Ambulatory Visit (HOSPITAL_COMMUNITY): Payer: Self-pay | Admitting: *Deleted

## 2021-05-22 ENCOUNTER — Inpatient Hospital Stay (HOSPITAL_COMMUNITY): Payer: Medicare HMO

## 2021-05-22 DIAGNOSIS — Z9181 History of falling: Secondary | ICD-10-CM | POA: Diagnosis not present

## 2021-05-22 DIAGNOSIS — M25461 Effusion, right knee: Secondary | ICD-10-CM | POA: Diagnosis not present

## 2021-05-22 DIAGNOSIS — Z91199 Patient's noncompliance with other medical treatment and regimen due to unspecified reason: Secondary | ICD-10-CM | POA: Diagnosis not present

## 2021-05-22 DIAGNOSIS — I5031 Acute diastolic (congestive) heart failure: Secondary | ICD-10-CM

## 2021-05-22 DIAGNOSIS — Z7401 Bed confinement status: Secondary | ICD-10-CM | POA: Diagnosis not present

## 2021-05-22 DIAGNOSIS — J9601 Acute respiratory failure with hypoxia: Secondary | ICD-10-CM | POA: Diagnosis present

## 2021-05-22 DIAGNOSIS — R531 Weakness: Secondary | ICD-10-CM | POA: Diagnosis present

## 2021-05-22 DIAGNOSIS — N1831 Chronic kidney disease, stage 3a: Secondary | ICD-10-CM | POA: Diagnosis not present

## 2021-05-22 DIAGNOSIS — Z7901 Long term (current) use of anticoagulants: Secondary | ICD-10-CM | POA: Diagnosis not present

## 2021-05-22 DIAGNOSIS — I10 Essential (primary) hypertension: Secondary | ICD-10-CM

## 2021-05-22 DIAGNOSIS — J449 Chronic obstructive pulmonary disease, unspecified: Secondary | ICD-10-CM

## 2021-05-22 DIAGNOSIS — I509 Heart failure, unspecified: Secondary | ICD-10-CM

## 2021-05-22 DIAGNOSIS — Z6841 Body Mass Index (BMI) 40.0 and over, adult: Secondary | ICD-10-CM | POA: Diagnosis not present

## 2021-05-22 DIAGNOSIS — Z794 Long term (current) use of insulin: Secondary | ICD-10-CM | POA: Diagnosis not present

## 2021-05-22 DIAGNOSIS — I130 Hypertensive heart and chronic kidney disease with heart failure and stage 1 through stage 4 chronic kidney disease, or unspecified chronic kidney disease: Secondary | ICD-10-CM | POA: Diagnosis present

## 2021-05-22 DIAGNOSIS — M25561 Pain in right knee: Secondary | ICD-10-CM | POA: Diagnosis not present

## 2021-05-22 DIAGNOSIS — I480 Paroxysmal atrial fibrillation: Secondary | ICD-10-CM | POA: Diagnosis present

## 2021-05-22 DIAGNOSIS — Z79899 Other long term (current) drug therapy: Secondary | ICD-10-CM | POA: Diagnosis not present

## 2021-05-22 DIAGNOSIS — E1122 Type 2 diabetes mellitus with diabetic chronic kidney disease: Secondary | ICD-10-CM | POA: Diagnosis present

## 2021-05-22 DIAGNOSIS — E876 Hypokalemia: Secondary | ICD-10-CM | POA: Diagnosis not present

## 2021-05-22 DIAGNOSIS — I5033 Acute on chronic diastolic (congestive) heart failure: Secondary | ICD-10-CM | POA: Diagnosis not present

## 2021-05-22 DIAGNOSIS — Y92009 Unspecified place in unspecified non-institutional (private) residence as the place of occurrence of the external cause: Secondary | ICD-10-CM | POA: Diagnosis not present

## 2021-05-22 DIAGNOSIS — E1140 Type 2 diabetes mellitus with diabetic neuropathy, unspecified: Secondary | ICD-10-CM | POA: Diagnosis present

## 2021-05-22 DIAGNOSIS — W19XXXA Unspecified fall, initial encounter: Secondary | ICD-10-CM | POA: Diagnosis present

## 2021-05-22 DIAGNOSIS — G4733 Obstructive sleep apnea (adult) (pediatric): Secondary | ICD-10-CM

## 2021-05-22 DIAGNOSIS — F17210 Nicotine dependence, cigarettes, uncomplicated: Secondary | ICD-10-CM | POA: Diagnosis present

## 2021-05-22 DIAGNOSIS — Z20822 Contact with and (suspected) exposure to covid-19: Secondary | ICD-10-CM | POA: Diagnosis present

## 2021-05-22 LAB — BRAIN NATRIURETIC PEPTIDE: B Natriuretic Peptide: 182 pg/mL — ABNORMAL HIGH (ref 0.0–100.0)

## 2021-05-22 LAB — BASIC METABOLIC PANEL
Anion gap: 7 (ref 5–15)
BUN: 25 mg/dL — ABNORMAL HIGH (ref 8–23)
CO2: 31 mmol/L (ref 22–32)
Calcium: 8.6 mg/dL — ABNORMAL LOW (ref 8.9–10.3)
Chloride: 100 mmol/L (ref 98–111)
Creatinine, Ser: 1.36 mg/dL — ABNORMAL HIGH (ref 0.61–1.24)
GFR, Estimated: 54 mL/min — ABNORMAL LOW (ref >60)
Glucose, Bld: 167 mg/dL — ABNORMAL HIGH (ref 70–99)
Potassium: 3.5 mmol/L (ref 3.5–5.1)
Sodium: 138 mmol/L (ref 135–145)

## 2021-05-22 LAB — GLUCOSE, CAPILLARY
Glucose-Capillary: 176 mg/dL — ABNORMAL HIGH (ref 70–99)
Glucose-Capillary: 238 mg/dL — ABNORMAL HIGH (ref 70–99)
Glucose-Capillary: 141 mg/dL — ABNORMAL HIGH (ref 70–99)

## 2021-05-22 LAB — ECHOCARDIOGRAM COMPLETE
Area-P 1/2: 3.42 cm2
Height: 66 in
S' Lateral: 2.6 cm
Weight: 4119.96 oz

## 2021-05-22 LAB — CBG MONITORING, ED: Glucose-Capillary: 178 mg/dL — ABNORMAL HIGH (ref 70–99)

## 2021-05-22 MED ORDER — PERFLUTREN LIPID MICROSPHERE
1.0000 mL | INTRAVENOUS | Status: AC | PRN
  Administered 2021-05-22: 2 mL via INTRAVENOUS
  Filled 2021-05-22: qty 10

## 2021-05-22 MED ORDER — NICOTINE 14 MG/24HR TD PT24: 14.0000 mg | MEDICATED_PATCH | Freq: Every day | TRANSDERMAL | Status: DC | PRN

## 2021-05-22 MED ORDER — INSULIN ASPART PROT & ASPART (70-30 MIX) 100 UNIT/ML ~~LOC~~ SUSP
15.0000 [IU] | Freq: Two times a day (BID) | SUBCUTANEOUS | Status: DC
  Administered 2021-05-22 – 2021-05-24 (×4): 15 [IU] via SUBCUTANEOUS
  Filled 2021-05-22: qty 10

## 2021-05-22 NOTE — Assessment & Plan Note (Addendum)
Counseled on cessation Advised patient that he must not smoke while wearing oxygen

## 2021-05-22 NOTE — Progress Notes (Signed)
Placed patient on [place] HFNC.  Patient is currently on 7L with a sat of 91% and seems to be holding above 90%.  Patient does have a history of OSA but is not currently using anything at home.  Will continue to monitor patient and RN made aware.

## 2021-05-22 NOTE — Assessment & Plan Note (Deleted)
He presents volume overloaded Continue IV Lasix for diuresis Monitor daily weights, strict intake and output Follow electrolytes Follow-up chest x-ray in a.m. after diuresis

## 2021-05-22 NOTE — Assessment & Plan Note (Addendum)
Continue bronchodilators as ordered Strongly advised tobacco cessation

## 2021-05-22 NOTE — Assessment & Plan Note (Addendum)
Continue novolog 70/30 and SSI

## 2021-05-22 NOTE — Progress Notes (Signed)
*  PRELIMINARY RESULTS* Echocardiogram 2D Echocardiogram has been performed with Definity.  Samuel Germany 05/22/2021, 3:03 PM

## 2021-05-22 NOTE — Hospital Course (Addendum)
Craig Black is a 76 y.o. male with medical history significant for COPD, diabetes mellitus, obesity, atrial fibrillation on chronic anticoagulation, OSA.  Patient presented to the ED via EMS with reports of generalized weakness, and difficulty breathing that started 3 weeks ago.  Spouse is at bedside.  Patient's problem started about 3 weeks ago when he fell off of his car, on his right leg, he has since had pain in his right leg, he reports weakness also.  Because of this he has not been ambulating and has been lying in bed unable to walk.  He has been wearing depends and his wife has been helping to clean after bowel movements.  He reports poor oral intake over the past 3 weeks.  He is on Lasix and Eliquis and reports compliance with both. He apparently has been bedbound at home for last 3 weeks after a fall at home.  Patient was admitted with acute diastolic heart failure and started on IV Lasix.  2/8: Patient wanting to go home.  He was on 8 L high flow nasal cannula O2 which was weaned down to 6 L prior to my examination.  Urine output recorded 1800 mL last 24 hours.  His IV access was lost this morning, can change Lasix to p.o.  He remains very weak and will need PT evaluation prior to discharge for dispo planning.  Replace potassium today.  2/9: Patient was recommended for SNF by PT.  Patient is declining SNF placement and wants to go home with home health.  He really wants to go home today.  He was weaned down to 2 L oxygen, but was unable to wean down any further due to desaturation.  Urine output is recorded 1700 mL +3 unrecorded outputs.  We discussed the importance of adherence to low-sodium, fluid restriction diet  2/10 day of discharge: Patient feeling well.  He remains on 2 L oxygen.  Urine output recorded as 3047m in the last 24 hours.  Patient has been diuresing well on p.o. Lasix.  He met with dietitian for heart healthy diet and sodium restriction, fluid restriction diet recommendations.   Patient feeling ready to discharge home with home health.

## 2021-05-22 NOTE — ED Notes (Signed)
Rt called and consulted regarding pt's O2 sat. Rt will assess pt

## 2021-05-22 NOTE — Assessment & Plan Note (Addendum)
Secondary to CHF Weaned down, now on 2 L Home oxygen desat screen complete, ordered DME O2 for discharge

## 2021-05-22 NOTE — Assessment & Plan Note (Deleted)
Low-fat diet ordered

## 2021-05-22 NOTE — Progress Notes (Signed)
Patient refusing CPAP for tonight. 

## 2021-05-22 NOTE — ED Notes (Signed)
Transferred to inpatient bed, report called by night shift nurse prior to departure.

## 2021-05-22 NOTE — Assessment & Plan Note (Addendum)
Continue carvedilol, eliquis

## 2021-05-22 NOTE — Assessment & Plan Note (Addendum)
CKD stage IIIa, GFR 53 Baseline creatinine ranging 1.6-2.4 (2019-2022) Stable

## 2021-05-22 NOTE — Assessment & Plan Note (Addendum)
Echocardiogram revealed EF 60% Continue p.o. Lasix Strict I's and O's, daily weights Heart healthy, fluid restriction diet

## 2021-05-22 NOTE — Assessment & Plan Note (Signed)
Continue nightly CPAP as ordered

## 2021-05-22 NOTE — Assessment & Plan Note (Addendum)
Continue Norvasc, carvedilol

## 2021-05-22 NOTE — Progress Notes (Addendum)
PROGRESS NOTE   HESTER FORGET  LFY:[id number] DOB: January 06, 1946 DOA: 05/21/2021 PCP: Carrolyn Meiers, MD   Chief Complaint  Patient presents with   Weakness   Level of care: Telemetry  Brief Admission History:  76 y.o. male with medical history significant for COPD, diabetes mellitus, obesity, atrial fibrillation on chronic anticoagulation, OSA.  Patient presented to the ED via EMS with reports of generalized weakness, and difficulty breathing that started 3 weeks ago.  Spouse Is at bedside.  Patient's problem started about 3 weeks ago when he fell off of his car, on his right leg, he has since had pain in his right leg, he reports weakness also.  Because of this he has not been ambulating and has been lying in bed unable to walk.  He has been wearing depends and his wife has been helped pain to clean after bowel movements.  Reports poor oral intake over the past 3 weeks.  He is unaware of any lower extremity swelling.  He denies abdominal bloating.  He has had worsening of his difficulty breathing and worsening of his cough.  No chest pain.  He is on Lasix and Eliquis and reports compliance with both.  He was admitted with acute heart failure.  He apparently has been bedbound at home for last 3 weeks after a fall at home.     Assessment and Plan: * Acute heart failure ([place]) He presents volume overloaded Continue IV Lasix for diuresis Monitor daily weights, strict intake and output Follow electrolytes Follow-up chest x-ray in a.m. after diuresis  CKD (chronic kidney disease) stage 3, GFR 30-59 ml/min (HCC)- (present on admission) Renally dose medications Follow daily BMP  Acute on chronic diastolic CHF (congestive heart failure) ([place])- (present on admission) He is markedly volume overloaded Follow-up updated 2D echocardiogram Continue IV Lasix as ordered  Acute respiratory failure with hypoxia ([place])- (present on admission) Suspect secondary to acute heart failure superimposed  with COPD Continue supportive measures as ordered Nightly CPAP ordered however patient admits he does not use at home  Diabetes mellitus ([place]) We have resumed his home 70/30 insulin at a reduced dose until we can see his glucose trend Follow CBGs closely Hypoglycemia precautions  Obesity- (present on admission) Low-fat diet ordered  Tobacco abuse- (present on admission) Counseled on cessation by MD at bedside  COPD (chronic obstructive pulmonary disease) ([place])- (present on admission) Continue bronchodilators as ordered Strongly advised tobacco cessation Will offer nicotine patch  Paroxysmal A-fib ([place])- (present on admission) Continue carvedilol for rate control apixaban for full anticoagulation  OSA (obstructive sleep apnea)- (present on admission) Continue nightly CPAP as ordered  Hypertension- (present on admission) -Resume Norvasc, carvedilol, tamsulosin  DVT prophylaxis:  DVT Prophylaxis  .Apixaban (eliquis) tablet 5 mg , Place ted hose       Code Status: Full  Family Communication: patient and I discussed at bedside 2/7 Disposition: TBD Status is: Inpatient  Remains inpatient appropriate because: IV treatments and severity of illness    Consultants:    Procedures:    Antimicrobials:     Subjective: Pt reports that his legs are very swollen and his abdomen is very swollen.  He is short of breath.   Objective: Vitals:   05/22/21 0840 05/22/21 0842 05/22/21 1139 05/22/21 1248  BP: (!) 130/59   127/60  Pulse: 70   60  Resp: 18   18  Temp: 97.9 F (36.6 C)   98.3 F (36.8 C)  TempSrc: Oral   Oral  SpO2: 100%  97% 92%  Weight:  116.8 kg    Height:  5\' 6"  (1.676 m)      Intake/Output Summary (Last 24 hours) at 05/22/2021 1635 Last data filed at 05/22/2021 1441 Gross per 24 hour  Intake 980 ml  Output 1900 ml  Net -920 ml   Filed Weights   05/21/21 1629 05/22/21 0842  Weight: 120.2 kg 116.8 kg    Examination:  General exam: chronically ill  appearing male, awake, alert, cooperative, Appears calm and comfortable  Respiratory system: shallow breathing bilateral. Mild increased work of breathing.  Cardiovascular system: normal S1 & S2 heard.  2+ pitting edema bilateral LEs.  Gastrointestinal system: Abdomen is obese, mildly distended, soft and nontender. No organomegaly or masses felt. Normal bowel sounds heard. Central nervous system: Alert and oriented. No focal neurological deficits. Extremities: Symmetric 5 x 5 power. Skin: No rashes, lesions or ulcers. Psychiatry: Judgement and insight appear normal. Mood & affect appropriate.   Data Reviewed: I have personally reviewed following labs and imaging studies      CBC: Recent Labs  Lab 05/21/21 1645  WBC 8.0  NEUTROABS 6.6  HGB 15.3  HCT 48.5  MCV 99.0  PLT 786    Basic Metabolic Panel: Recent Labs  Lab 05/21/21 1645 05/21/21 1846 05/22/21 0355  NA 138  --  138  K 3.8  --  3.5  CL 98  --  100  CO2 28  --  31  GLUCOSE 216*  --  167*  BUN 29*  --  25*  CREATININE 1.48*  --  1.36*  CALCIUM 9.1  --  8.6*  MG  --  1.9  --     CBG: Recent Labs  Lab 05/21/21 2344 05/22/21 0743 05/22/21 1121  GLUCAP 173* 178* 176*    Recent Results (from the past 240 hour(s))  Resp Panel by RT-PCR (Flu A&B, Covid) Nasopharyngeal Swab     Status: None   Collection Time: 05/21/21  4:40 PM   Specimen: Nasopharyngeal Swab; Nasopharyngeal(NP) swabs in vial transport medium  Result Value Ref Range Status   SARS Coronavirus 2 by RT PCR NEGATIVE NEGATIVE Final    Comment: (NOTE) SARS-CoV-2 target nucleic acids are NOT DETECTED.  The SARS-CoV-2 RNA is generally detectable in upper respiratory specimens during the acute phase of infection. The lowest concentration of SARS-CoV-2 viral copies this assay can detect is 138 copies/mL. A negative result does not preclude SARS-Cov-2 infection and should not be used as the sole basis for treatment or other patient management  decisions. A negative result may occur with  improper specimen collection/handling, submission of specimen other than nasopharyngeal swab, presence of viral mutation(s) within the areas targeted by this assay, and inadequate number of viral copies(<138 copies/mL). A negative result must be combined with clinical observations, patient history, and epidemiological information. The expected result is Negative.  Fact Sheet for Patients:  EntrepreneurPulse.com.au  Fact Sheet for Healthcare Providers:  IncredibleEmployment.be  This test is no t yet approved or cleared by the [place] FDA and  has been authorized for detection and/or diagnosis of SARS-CoV-2 by FDA under an Emergency Use Authorization (EUA). This EUA will remain  in effect (meaning this test can be used) for the duration of the COVID-19 declaration under Section 564(b)(1) of the Act, 21 U.S.C.section 360bbb-3(b)(1), unless the authorization is terminated  or revoked sooner.       Influenza A by PCR NEGATIVE NEGATIVE Final   Influenza B by PCR NEGATIVE  NEGATIVE Final    Comment: (NOTE) The Xpert Xpress SARS-CoV-2/FLU/RSV plus assay is intended as an aid in the diagnosis of influenza from Nasopharyngeal swab specimens and should not be used as a sole basis for treatment. Nasal washings and aspirates are unacceptable for Xpert Xpress SARS-CoV-2/FLU/RSV testing.  Fact Sheet for Patients: EntrepreneurPulse.com.au  Fact Sheet for Healthcare Providers: IncredibleEmployment.be  This test is not yet approved or cleared by the [place] FDA and has been authorized for detection and/or diagnosis of SARS-CoV-2 by FDA under an Emergency Use Authorization (EUA). This EUA will remain in effect (meaning this test can be used) for the duration of the COVID-19 declaration under Section 564(b)(1) of the Act, 21 U.S.C. section 360bbb-3(b)(1), unless the  authorization is terminated or revoked.  Performed at [facility], [address], [place], [place] [address]      Radiology Studies: DG Chest 1 View  Result Date: 05/21/2021 CLINICAL DATA:  Recent fall, history of COPD EXAM: CHEST  1 VIEW COMPARISON:  Chest radiograph 03/12/2021 FINDINGS: Mildly enlarged cardiac silhouette, likely accentuated by technique. Layering moderate-sized right and small left pleural effusions with adjacent atelectasis. No pneumothorax. No acute osseous abnormality. IMPRESSION: Moderate right and small left pleural effusions with adjacent atelectasis. Electronically Signed   By: Maurine Simmering M.D.   On: 05/21/2021 18:39   CT HEAD WO CONTRAST (5MM)  Result Date: 05/21/2021 CLINICAL DATA:  Head trauma, moderate-severe EXAM: CT HEAD WITHOUT CONTRAST TECHNIQUE: Contiguous axial images were obtained from the base of the skull through the vertex without intravenous contrast. RADIATION DOSE REDUCTION: This exam was performed according to the departmental dose-optimization program which includes automated exposure control, adjustment of the mA and/or kV according to patient size and/or use of iterative reconstruction technique. COMPARISON:  CT 11/14/2017 FINDINGS: Brain: No evidence of acute intracranial hemorrhage or extra-axial collection.No evidence of mass lesion/concern mass effect.The ventricles are normal in size.Scattered subcortical and periventricular white matter hypodensities, nonspecific but likely sequela of chronic small vessel ischemic disease.Mild cerebral atrophy Vascular: No hyperdense vessel or unexpected calcification. Skull: Normal. Negative for fracture or focal lesion. Sinuses/Orbits: No acute finding. Other: Right mastoid effusion. IMPRESSION: No acute intracranial abnormality.  Right mastoid effusion. Mild sequela of chronic small vessel ischemic disease. Electronically Signed   By: Maurine Simmering M.D.   On: 05/21/2021 18:43   CT Angio Chest PE W and/or Wo  Contrast  Result Date: 05/21/2021 CLINICAL DATA:  Pulmonary embolism (PE) suspected, high prob EXAM: CT ANGIOGRAPHY CHEST WITH CONTRAST TECHNIQUE: Multidetector CT imaging of the chest was performed using the standard protocol during bolus administration of intravenous contrast. Multiplanar CT image reconstructions and MIPs were obtained to evaluate the vascular anatomy. RADIATION DOSE REDUCTION: This exam was performed according to the departmental dose-optimization program which includes automated exposure control, adjustment of the mA and/or kV according to patient size and/or use of iterative reconstruction technique. CONTRAST:  14mL OMNIPAQUE IOHEXOL 350 MG/ML SOLN COMPARISON:  Same day radiograph FINDINGS: Cardiovascular: Normal cardiac size.Trace pericardial effusion. Coronary artery calcifications.No evidence of pulmonary embolism. Normal size main and branch pulmonary arteries.Moderate atherosclerotic calcifications of the aortic arch and descending aorta. Common origin of the brachiocephalic and left common carotid arteries. Mediastinum/Nodes: There are few prominent mediastinal lymph nodes, likely reactive.No axillary adenopathy.The thyroid is unremarkable.Esophagus is unremarkable. Lungs/Pleura: The central airways are patent. There is mild bronchial wall thickening.Moderate right and small left pleural effusions with adjacent atelectasis.No suspicious pulmonary nodules or masses. Mild centrilobular emphysema, mid apical predominant. There is no  pneumothorax.No visible suspicious pulmonary nodule or mass. Upper Abdomen: No acute abnormality. Musculoskeletal: No acute osseous abnormality.No suspicious lytic or blastic lesions. Multilevel degenerative changes of the spine. Review of the MIP images confirms the above findings. IMPRESSION: Moderate right and small left pleural effusions with adjacent atelectasis. Trace pericardial effusion. No evidence of pulmonary embolism. Aortic Atherosclerosis  (ICD10-I70.0) and Emphysema (ICD10-J43.9). Electronically Signed   By: Maurine Simmering M.D.   On: 05/21/2021 18:48   DG Knee Complete 4 Views Right  Result Date: 05/22/2021 CLINICAL DATA:  Pain following fall EXAM: RIGHT KNEE - COMPLETE 4 VIEW COMPARISON:  None. FINDINGS: No acute fracture or dislocation. Small joint effusion. Mild tricompartmental degenerative changes. Vascular calcifications. IMPRESSION: No acute osseous abnormality. Electronically Signed   By: Yetta Glassman M.D.   On: 05/22/2021 08:15   DG Hip Unilat W or Wo Pelvis 2-3 Views Right  Result Date: 05/21/2021 CLINICAL DATA:  Weakness, recent fall EXAM: DG HIP (WITH OR WITHOUT PELVIS) 2-3V RIGHT COMPARISON:  None. FINDINGS: There is no evidence of acute fracture. There is mild bilateral hip osteoarthritis. Pelvic phleboliths. Vascular calcifications. IMPRESSION: No evidence of acute right hip fracture. Electronically Signed   By: Maurine Simmering M.D.   On: 05/21/2021 18:40    Scheduled Meds:  amLODipine  10 mg Oral Daily   apixaban  5 mg Oral BID   carvedilol  6.25 mg Oral BID   fluticasone furoate-vilanterol  1 puff Inhalation Daily   And   umeclidinium bromide  1 puff Inhalation Daily   furosemide  40 mg Intravenous BID   insulin aspart  0-15 Units Subcutaneous TID WC   insulin aspart  0-5 Units Subcutaneous QHS   insulin aspart protamine- aspart  15 Units Subcutaneous BID WC   Continuous Infusions:   LOS: 0 days   Time spent: 37 mins  Leiam Hopwood Wynetta Emery, MD How to contact the [facility] Attending or Consulting provider [place] or covering provider during after hours [place], for this patient?  Check the care team in [facility] and look for a) attending/consulting TR[facility] provider listed and b) the [facility] team listed Log into [email or web address] and use [place]'s universal password to access. If you do not have the password, please contact the hospital operator. Locate the [facility] provider you are looking for under [facility] and page to a number  that you can be directly reached. If you still have difficulty reaching the provider, please page the [facility] (Director on Call) for the Hospitalists listed on amion for assistance.  05/22/2021, 4:35 PM

## 2021-05-23 DIAGNOSIS — E876 Hypokalemia: Secondary | ICD-10-CM

## 2021-05-23 LAB — BRAIN NATRIURETIC PEPTIDE: B Natriuretic Peptide: 110 pg/mL — ABNORMAL HIGH (ref 0.0–100.0)

## 2021-05-23 LAB — BASIC METABOLIC PANEL
Anion gap: 7 (ref 5–15)
BUN: 25 mg/dL — ABNORMAL HIGH (ref 8–23)
CO2: 32 mmol/L (ref 22–32)
Calcium: 8.7 mg/dL — ABNORMAL LOW (ref 8.9–10.3)
Chloride: 99 mmol/L (ref 98–111)
Creatinine, Ser: 1.37 mg/dL — ABNORMAL HIGH (ref 0.61–1.24)
GFR, Estimated: 53 mL/min — ABNORMAL LOW (ref >60)
Glucose, Bld: 136 mg/dL — ABNORMAL HIGH (ref 70–99)
Potassium: 3.2 mmol/L — ABNORMAL LOW (ref 3.5–5.1)
Sodium: 138 mmol/L (ref 135–145)

## 2021-05-23 LAB — GLUCOSE, CAPILLARY
Glucose-Capillary: 156 mg/dL — ABNORMAL HIGH (ref 70–99)
Glucose-Capillary: 237 mg/dL — ABNORMAL HIGH (ref 70–99)
Glucose-Capillary: 260 mg/dL — ABNORMAL HIGH (ref 70–99)
Glucose-Capillary: 197 mg/dL — ABNORMAL HIGH (ref 70–99)

## 2021-05-23 LAB — MAGNESIUM: Magnesium: 1.7 mg/dL (ref 1.7–2.4)

## 2021-05-23 MED ORDER — LIVING BETTER WITH HEART FAILURE BOOK: Freq: Once | Status: AC

## 2021-05-23 MED ORDER — POTASSIUM CHLORIDE CRYS ER 20 MEQ PO TBCR
40.0000 meq | EXTENDED_RELEASE_TABLET | Freq: Two times a day (BID) | ORAL | Status: AC
  Administered 2021-05-23 (×2): 40 meq via ORAL
  Filled 2021-05-23 (×2): qty 2

## 2021-05-23 MED ORDER — FUROSEMIDE 40 MG PO TABS
40.0000 mg | ORAL_TABLET | Freq: Two times a day (BID) | ORAL | Status: DC
  Administered 2021-05-23 – 2021-05-25 (×5): 40 mg via ORAL
  Filled 2021-05-23 (×5): qty 1

## 2021-05-23 NOTE — Evaluation (Signed)
Physical Therapy Evaluation Patient Details Name: Craig Black MRN: [id number] DOB: 1945/11/08 Today's Date: 05/23/2021  History of Present Illness  RENN DIROCCO is a 76 y.o. male with medical history significant for COPD, diabetes mellitus, obesity, atrial fibrillation on chronic anticoagulation, OSA.  Patient presented to the ED via EMS with reports of generalized weakness, and difficulty breathing that started 3 weeks ago.  Spouse Is at bedside.  Patient's problem started about 3 weeks ago when he fell off of his car, on his right leg, he has since had pain in his right leg, he reports weakness also.  Because of this he has not been ambulating and has been lying in bed unable to walk.  He has been wearing depends and his wife has been helped pain to clean after bowel movements.  Reports poor oral intake over the past 3 weeks.  He is unaware of any lower extremity swelling.  He denies abdominal bloating.  He has had worsening of his difficulty breathing and worsening of his cough.  No chest pain.  He is on Lasix and Eliquis and reports compliance with both.   Clinical Impression  Patient very unsteady on feet having to lean on nearby objects for support when attempting gait without AD and required RW for safety.  Patient demonstrates slow labored unsteady cadence with occasional buckling of knees due weakness, on room air with SpO2 droppring from 91% to 84%, limited mostly due to fatigue and SOB.  Patient put back on 2 LPM O2 after transferring to chair with SpO2 at 90-91% - RN notified.  Patient tolerated sitting up in chair after therapy with caregiver present in room after therapy.  Patient will benefit from continued skilled physical therapy in hospital and recommended venue below to increase strength, balance, endurance for safe ADLs and gait.       Recommendations for follow up therapy are one component of a multi-disciplinary discharge planning process, led by the attending physician.   Recommendations may be updated based on patient status, additional functional criteria and insurance authorization.  Follow Up Recommendations Skilled nursing-short term rehab (<3 hours/day)    Assistance Recommended at Discharge Intermittent Supervision/Assistance  Patient can return home with the following  A little help with bathing/dressing/bathroom;Help with stairs or ramp for entrance;Assistance with cooking/housework;A lot of help with walking and/or transfers    Equipment Recommendations Rolling walker (2 wheels);BSC/3in1  Recommendations for Other Services       Functional Status Assessment       Precautions / Restrictions Precautions Precautions: Fall Restrictions Weight Bearing Restrictions: No      Mobility  Bed Mobility Overal bed mobility: Needs Assistance Bed Mobility: Supine to Sit, Sit to Supine     Supine to sit: Supervision Sit to supine: Supervision   General bed mobility comments: slow labored movement    Transfers Overall transfer level: Needs assistance Equipment used: Rolling walker (2 wheels), None, 1 person hand held assist Transfers: Sit to/from Stand, Bed to chair/wheelchair/BSC Sit to Stand: Min assist           General transfer comment: slow labored movement having to lean on nearby objects for support without AD, required use of RW for safety    Ambulation/Gait Ambulation/Gait assistance: Min assist, Mod assist Gait Distance (Feet): 35 Feet Assistive device: Rolling walker (2 wheels) Gait Pattern/deviations: Decreased step length - right, Decreased step length - left, Decreased stride length Gait velocity: decreased     General Gait Details: demonstrates slow labored unsteady cadence  with occasional buckling of knees due weakness, on room air with SpO2 droppring from 91% to 84%, limited mostly due to fatigue and SOB  Stairs            Wheelchair Mobility    Modified Rankin (Stroke Patients Only)       Balance  Overall balance assessment: Needs assistance Sitting-balance support: Feet supported, No upper extremity supported Sitting balance-Leahy Scale: Good Sitting balance - Comments: seated at EOB   Standing balance support: During functional activity, No upper extremity supported Standing balance-Leahy Scale: Poor Standing balance comment: fair/poor without AD, fair using RW                             Pertinent Vitals/Pain Pain Assessment Pain Assessment: No/denies pain    Home Living Family/patient expects to be discharged to:: Private residence Living Arrangements: Non-relatives/Friends Available Help at Discharge: Personal care attendant;Available 24 hours/day Type of Home: Mobile home Home Access: Stairs to enter Entrance Stairs-Rails: Right;Left;Can reach both Entrance Stairs-Number of Steps: 4   Home Layout: One level Home Equipment: Conservation officer, nature (2 wheels);Wheelchair - manual;Cane - single point      Prior Function Prior Level of Function : Needs assist       Physical Assist : Mobility (physical);ADLs (physical) Mobility (physical): Bed mobility;Transfers;Gait;Stairs   Mobility Comments: household and short distanced community ambulator without AD ADLs Comments: Asissted by home aide 24/7     Hand Dominance        Extremity/Trunk Assessment   Upper Extremity Assessment Upper Extremity Assessment: Generalized weakness    Lower Extremity Assessment Lower Extremity Assessment: Generalized weakness    Cervical / Trunk Assessment Cervical / Trunk Assessment: Normal  Communication   Communication: HOH  Cognition Arousal/Alertness: Awake/alert Behavior During Therapy: WFL for tasks assessed/performed Overall Cognitive Status: Within Functional Limits for tasks assessed                                          General Comments      Exercises     Assessment/Plan    PT Assessment Patient needs continued PT services  PT  Problem List Decreased strength;Decreased activity tolerance;Decreased balance;Decreased mobility;Cardiopulmonary status limiting activity       PT Treatment Interventions DME instruction;Gait training;Stair training;Functional mobility training;Therapeutic activities;Therapeutic exercise;Balance training;Patient/family education    PT Goals (Current goals can be found in the Care Plan section)  Acute Rehab PT Goals Patient Stated Goal: return home after rehab PT Goal Formulation: With patient/family Time For Goal Achievement: 06/06/21 Potential to Achieve Goals: Good    Frequency Min 3X/week     Co-evaluation               AM-PAC PT "6 Clicks" Mobility  Outcome Measure Help needed turning from your back to your side while in a flat bed without using bedrails?: None Help needed moving from lying on your back to sitting on the side of a flat bed without using bedrails?: A Little Help needed moving to and from a bed to a chair (including a wheelchair)?: A Little Help needed standing up from a chair using your arms (e.g., wheelchair or bedside chair)?: A Little Help needed to walk in hospital room?: A Lot Help needed climbing 3-5 steps with a railing? : A Lot 6 Click Score: 17    End of Session  Equipment Utilized During Treatment: Oxygen Activity Tolerance: Patient tolerated treatment well;Patient limited by fatigue Patient left: in chair;with call bell/phone within reach;with family/visitor present;with chair alarm set Nurse Communication: Mobility status PT Visit Diagnosis: Unsteadiness on feet (R26.81);Other abnormalities of gait and mobility (R26.89);Muscle weakness (generalized) (M62.81)    Time: 3837-7939 PT Time Calculation (min) (ACUTE ONLY): 26 min   Charges:   PT Evaluation $PT Eval Moderate Complexity: 1 Mod PT Treatments $Therapeutic Activity: 23-37 mins        3:00 PM, 05/23/21 Lonell Grandchild, MPT Physical Therapist with [facility] 336 [phone] office [phone] mobile phone

## 2021-05-23 NOTE — Assessment & Plan Note (Deleted)
Replace, trend

## 2021-05-23 NOTE — Progress Notes (Addendum)
Progress Note   Patient: Craig Black INO:[id number] DOB: [birth year] DOA: 05/21/2021     1 DOS: the patient was seen and examined on 05/23/2021   Brief hospital course: Craig Black is a 76 y.o. male with medical history significant for COPD, diabetes mellitus, obesity, atrial fibrillation on chronic anticoagulation, OSA.  Patient presented to the ED via EMS with reports of generalized weakness, and difficulty breathing that started 3 weeks ago.  Spouse is at bedside.  Patient's problem started about 3 weeks ago when he fell off of his car, on his right leg, he has since had pain in his right leg, he reports weakness also.  Because of this he has not been ambulating and has been lying in bed unable to walk.  He has been wearing depends and his wife has been helping to clean after bowel movements.  He reports poor oral intake over the past 3 weeks.  He is on Lasix and Eliquis and reports compliance with both. He apparently has been bedbound at home for last 3 weeks after a fall at home.  Patient was admitted with acute diastolic heart failure and started on IV Lasix.  2/8: Patient wanting to go home.  He was on 8 L high flow nasal cannula O2 which was weaned down to 6 L prior to my examination.  Urine output recorded 1800 mL last 24 hours.  His IV access was lost this morning, can change Lasix to p.o.  He remains very weak and will need PT evaluation prior to discharge for dispo planning.  Replace potassium today.  Assessment and Plan: * Acute on chronic diastolic CHF (congestive heart failure) ([place])- (present on admission) Echocardiogram revealed EF 60% Change IV Lasix to p.o. today Strict I's and O's, daily weights  Acute respiratory failure with hypoxia ([place])- (present on admission) Secondary to CHF Continue to wean oxygen as able.  Wean down to 6 L today.  He does not wear oxygen at baseline  CKD (chronic kidney disease) stage 3, GFR 30-59 ml/min (HCC)- (present on admission) CKD stage IIIa,  GFR 53 Baseline creatinine ranging 1.6-2.4 (2019-2022) Stable  Diabetes mellitus (HCC) Continue novolog 70/30 and SSI   Paroxysmal A-fib ([place])- (present on admission) Continue carvedilol, eliquis   Hypertension- (present on admission) Continue Norvasc, carvedilol  Obesity, Class III, BMI 40-49.9 (morbid obesity) ([place]) Estimated body mass index is 40.64 kg/m as calculated from the following:   Height as of this encounter: 5\' 6"  (1.676 m).   Weight as of this encounter: 114.2 kg.   Hypokalemia Replace, trend  Obesity- (present on admission) Low-fat diet ordered  Tobacco abuse- (present on admission) Counseled on cessation  COPD (chronic obstructive pulmonary disease) ([place])- (present on admission) Continue bronchodilators as ordered Strongly advised tobacco cessation Will offer nicotine patch  OSA (obstructive sleep apnea)- (present on admission) Continue nightly CPAP as ordered         Physical Exam: Vitals:   05/23/21 0717 05/23/21 0800 05/23/21 0900 05/23/21 1225  BP:    (!) 112/97  Pulse:  73  85  Resp:    16  Temp:    97.8 F (36.6 C)  TempSrc:      SpO2: 93%  94% 93%  Weight:      Height:       Examination: General exam: Appears calm and comfortable  Respiratory system: Clear to auscultation. Respiratory effort normal. On 6L O2  Cardiovascular system: S1 & S2 heard, RRR. No pedal edema. Gastrointestinal system: Abdomen  is nondistended, soft  Central nervous system: Alert and oriented Extremities: Symmetric in appearance bilaterally  Psychiatry: Judgement and insight appear stable. Mood & affect appropriate.    Data Reviewed:  Potassium low 3.2, creatinine stable 1.37, BNP 110  Family Communication: Spouse at bedside  Disposition: Status is: Inpatient  Remains inpatient appropriate because: Remains on O2 and needs PT eval     Planned Discharge Destination:  Unclear. Needs PT.      Time spent:  minutes  Author: Dessa Phi,  DO 05/23/2021 3:18 PM  For on call review [email or web address].

## 2021-05-23 NOTE — Plan of Care (Signed)
°  Problem: Acute Rehab PT Goals(only PT should resolve) Goal: Pt Will Go Supine/Side To Sit Outcome: Progressing Flowsheets (Taken 05/23/2021 1506) Pt will go Supine/Side to Sit: with modified independence Goal: Patient Will Transfer Sit To/From Stand Outcome: Progressing Flowsheets (Taken 05/23/2021 1506) Patient will transfer sit to/from stand: with min guard assist Goal: Pt Will Transfer Bed To Chair/Chair To Bed Outcome: Progressing Flowsheets (Taken 05/23/2021 1506) Pt will Transfer Bed to Chair/Chair to Bed: min guard assist Goal: Pt Will Ambulate Outcome: Progressing Flowsheets (Taken 05/23/2021 1506) Pt will Ambulate:  75 feet  with min guard assist  with minimal assist  with rolling walker   3:07 PM, 05/23/21 Lonell Grandchild, MPT Physical Therapist with [facility] [phone] office [phone] mobile phone

## 2021-05-23 NOTE — TOC Initial Note (Signed)
Transition of Care [facility]) - Initial/Assessment Note    Patient Details  Name: Craig Black MRN: [id number] Date of Birth: February 05, 1946  [facility]) CM/SW Contact:    Salome Arnt, LCSW Phone Number: 05/23/2021, 12:46 PM  Clinical Narrative:  Pt admitted due to acute respiratory failure. TOC received consult for CHF screening. Assessment completed with pt and pt's girlfriend at bedside. Pt reports his girlfriend, Charleston Ropes lives with him. He indicates he is independent with ADLs at baseline. PT evaluated pt and recommend SNF. LCSW discussed with pt who refuses. He is agreeable to home health with no preference on agency. Referred and accepted by Marjory Lies with [facility]. Orders needed. CHF screening completed. Pt does not weigh himself regularly or follow heart healthy diet. LCSW provided brief education and ordered CHF book for pt. TOC will continue to follow.                  Expected Discharge Plan: [place] Barriers to Discharge: Continued Medical Work up   Patient Goals and CMS Choice Patient states their goals for this hospitalization and ongoing recovery are:: return home   Choice offered to / list presented to : Patient  Expected Discharge Plan and Services Expected Discharge Plan: [place] In-house Referral: Clinical Social Work   Post Acute Care Choice: [place] arrangements for the past 2 months: [place]: RN, PT, Social Work [place] Agency: [place] Date [place]: 05/23/21 Time [place]: 1246 Representative spoke with at [place]: Marjory Lies  Prior Living Arrangements/Services Living arrangements for the past 2 months: [place] with:: Significant Other Patient language and need for interpreter reviewed:: Yes Do you feel safe going back to the place where you live?: Yes          Current home services: DME  (cane, walker, wheelchair, shower chair, BSC) Criminal Activity/Legal Involvement Pertinent to Current Situation/Hospitalization: No - Comment as needed  Activities of Daily Living Home Assistive Devices/Equipment: None ADL Screening (condition at time of admission) Patient's cognitive ability adequate to safely complete daily activities?: Yes Is the patient deaf or have difficulty hearing?: Yes Does the patient have difficulty seeing, even when wearing glasses/contacts?: No Does the patient have difficulty concentrating, remembering, or making decisions?: No Patient able to express need for assistance with ADLs?: Yes Does the patient have difficulty dressing or bathing?: No Independently performs ADLs?: Yes (appropriate for developmental age) Does the patient have difficulty walking or climbing stairs?: Yes Weakness of Legs: Both Weakness of Arms/Hands: None  Permission Sought/Granted                  Emotional Assessment   Attitude/Demeanor/Rapport: Engaged Affect (typically observed): Appropriate Orientation: : Oriented to Self, Oriented to Place, Oriented to  Time, Oriented to Situation Alcohol / Substance Use: Not Applicable Psych Involvement: No (comment)  Admission diagnosis:  Weakness [R53.1] Pleural effusion [J90] Right knee pain [M25.561] Right leg weakness [R29.898] Inability to walk [R26.2] Acute respiratory failure with hypoxia ([place]) [J96.01] Fall at home [phone], E93.810] Acute heart failure ([place]) [I50.9] Patient Active Problem List   Diagnosis Date Noted   Hypokalemia 05/23/2021   Acute respiratory failure with hypoxia ([place]) 05/21/2021   Acute on chronic diastolic CHF (congestive heart failure) ([place]) 05/21/2021  CKD (chronic kidney disease) stage 3, GFR 30-59 ml/min (HCC) 05/21/2021   Diabetes mellitus ([place]) 06/26/2011   Acute renal failure ([employer]) 06/26/2011   Hypertension    OSA (obstructive sleep apnea)    Paroxysmal A-fib (HCC)    Neuropathy in  diabetes (HCC)    COPD (chronic obstructive pulmonary disease) (HCC)    Tobacco abuse    Hard of hearing    Obesity    PCP:  Carrolyn Meiers, MD Pharmacy:   [id number] - [place], [address] [place] [address] Phone: [phone] Fax: [phone]     Social Determinants of Health (SDOH) Interventions    Readmission Risk Interventions No flowsheet data found.

## 2021-05-23 NOTE — Assessment & Plan Note (Addendum)
Estimated body mass index is 40.64 kg/m as calculated from the following:   Height as of this encounter: 5\' 6"  (1.676 m).   Weight as of this encounter: 114.2 kg.

## 2021-05-24 DIAGNOSIS — R5381 Other malaise: Secondary | ICD-10-CM

## 2021-05-24 LAB — MAGNESIUM: Magnesium: 1.6 mg/dL — ABNORMAL LOW (ref 1.7–2.4)

## 2021-05-24 LAB — BASIC METABOLIC PANEL
Anion gap: 8 (ref 5–15)
BUN: 29 mg/dL — ABNORMAL HIGH (ref 8–23)
CO2: 33 mmol/L — ABNORMAL HIGH (ref 22–32)
Calcium: 9 mg/dL (ref 8.9–10.3)
Chloride: 97 mmol/L — ABNORMAL LOW (ref 98–111)
Creatinine, Ser: 1.63 mg/dL — ABNORMAL HIGH (ref 0.61–1.24)
GFR, Estimated: 43 mL/min — ABNORMAL LOW (ref >60)
Glucose, Bld: 193 mg/dL — ABNORMAL HIGH (ref 70–99)
Potassium: 3.7 mmol/L (ref 3.5–5.1)
Sodium: 138 mmol/L (ref 135–145)

## 2021-05-24 LAB — GLUCOSE, CAPILLARY
Glucose-Capillary: 187 mg/dL — ABNORMAL HIGH (ref 70–99)
Glucose-Capillary: 263 mg/dL — ABNORMAL HIGH (ref 70–99)
Glucose-Capillary: 208 mg/dL — ABNORMAL HIGH (ref 70–99)
Glucose-Capillary: 226 mg/dL — ABNORMAL HIGH (ref 70–99)

## 2021-05-24 MED ORDER — LIVING WELL WITH DIABETES BOOK: Freq: Once | Status: AC

## 2021-05-24 MED ORDER — INSULIN ASPART PROT & ASPART (70-30 MIX) 100 UNIT/ML ~~LOC~~ SUSP
25.0000 [IU] | Freq: Every day | SUBCUTANEOUS | Status: DC
  Administered 2021-05-25: 25 [IU] via SUBCUTANEOUS
  Filled 2021-05-24: qty 10

## 2021-05-24 MED ORDER — POTASSIUM CHLORIDE CRYS ER 20 MEQ PO TBCR
40.0000 meq | EXTENDED_RELEASE_TABLET | Freq: Once | ORAL | Status: DC
Start: 2021-05-24 — End: 2021-05-24

## 2021-05-24 MED ORDER — INSULIN ASPART PROT & ASPART (70-30 MIX) 100 UNIT/ML ~~LOC~~ SUSP
15.0000 [IU] | Freq: Every day | SUBCUTANEOUS | Status: DC
  Administered 2021-05-24: 15 [IU] via SUBCUTANEOUS
  Filled 2021-05-24: qty 10

## 2021-05-24 MED ORDER — POTASSIUM CHLORIDE CRYS ER 20 MEQ PO TBCR
40.0000 meq | EXTENDED_RELEASE_TABLET | Freq: Once | ORAL | Status: AC
  Administered 2021-05-24: 40 meq via ORAL
  Filled 2021-05-24: qty 2

## 2021-05-24 MED ORDER — MAGNESIUM SULFATE 2 GM/50ML IV SOLN
2.0000 g | Freq: Once | INTRAVENOUS | Status: AC
  Administered 2021-05-24: 2 g via INTRAVENOUS
  Filled 2021-05-24: qty 50

## 2021-05-24 NOTE — Progress Notes (Signed)
SATURATION QUALIFICATIONS: (This note is used to comply with regulatory documentation for home oxygen)  Patient Saturations on Room Air at Rest = 85%  Patient Saturations on Room Air while Ambulating = 85%  Patient Saturations on 2 Liters of oxygen while Ambulating = 91%  Please briefly explain why patient needs home oxygen:  Patient at rest on room air oxygen saturation dropped to 85% without exertion.

## 2021-05-24 NOTE — Assessment & Plan Note (Deleted)
Replace, trend

## 2021-05-24 NOTE — Progress Notes (Signed)
Progress Note   Patient: Craig Black OVZ:[id number] DOB: 07-23-1945 DOA: 05/21/2021     2 DOS: the patient was seen and examined on 05/24/2021   Brief hospital course: Craig Black is a 76 y.o. male with medical history significant for COPD, diabetes mellitus, obesity, atrial fibrillation on chronic anticoagulation, OSA.  Patient presented to the ED via EMS with reports of generalized weakness, and difficulty breathing that started 3 weeks ago.  Spouse is at bedside.  Patient's problem started about 3 weeks ago when he fell off of his car, on his right leg, he has since had pain in his right leg, he reports weakness also.  Because of this he has not been ambulating and has been lying in bed unable to walk.  He has been wearing depends and his wife has been helping to clean after bowel movements.  He reports poor oral intake over the past 3 weeks.  He is on Lasix and Eliquis and reports compliance with both. He apparently has been bedbound at home for last 3 weeks after a fall at home.  Patient was admitted with acute diastolic heart failure and started on IV Lasix.  2/8: Patient wanting to go home.  He was on 8 L high flow nasal cannula O2 which was weaned down to 6 L prior to my examination.  Urine output recorded 1800 mL last 24 hours.  His IV access was lost this morning, can change Lasix to p.o.  He remains very weak and will need PT evaluation prior to discharge for dispo planning.  Replace potassium today.  2/9: Patient was recommended for SNF by PT.  Patient is declining SNF placement and wants to go home with home health.  He really wants to go home today.  He was weaned down to 2 L oxygen, but was unable to wean down any further due to desaturation.  Urine output is recorded 1700 mL +3 unrecorded outputs.  We discussed the importance of adherence to low-sodium, fluid restriction diet  Assessment and Plan: * Acute on chronic diastolic CHF (congestive heart failure) ([place])- (present on  admission) Echocardiogram revealed EF 60% Continue p.o. Lasix Strict I's and O's, daily weights Heart healthy, fluid restriction diet  Acute respiratory failure with hypoxia ([place])- (present on admission) Secondary to CHF Continue to wean oxygen as able.  Wean down to 2 L today.  He does not wear oxygen at baseline Home oxygen desat screen complete, ordered DME O2 for discharge  CKD (chronic kidney disease) stage 3, GFR 30-59 ml/min (HCC)- (present on admission) CKD stage IIIa, GFR 53 Baseline creatinine ranging 1.6-2.4 (2019-2022) Stable  Diabetes mellitus (HCC) Continue novolog 70/30 and SSI   Paroxysmal A-fib ([place])- (present on admission) Continue carvedilol, eliquis   Hypertension- (present on admission) Continue Norvasc, carvedilol  Physical deconditioning Patient declining SNF placement, wants to go home with home health.  TOC aware.  Hypomagnesemia Replace, trend  Obesity, Class III, BMI 40-49.9 (morbid obesity) (HCC) Estimated body mass index is 40.64 kg/m as calculated from the following:   Height as of this encounter: 5\' 6"  (1.676 m).   Weight as of this encounter: 114.2 kg.   Hypokalemia Replace, trend  Tobacco abuse- (present on admission) Counseled on cessation  COPD (chronic obstructive pulmonary disease) ([place])- (present on admission) Continue bronchodilators as ordered Strongly advised tobacco cessation Will offer nicotine patch  OSA (obstructive sleep apnea)- (present on admission) Continue nightly CPAP as ordered         Physical Exam:  Vitals:   05/24/21 0500 05/24/21 0551 05/24/21 0800 05/24/21 0942  BP:  (!) 131/57    Pulse:  (!) 110    Resp:  20    Temp:  98.2 F (36.8 C)    TempSrc:  Oral    SpO2:  (!) 80% 90% 92%  Weight: 113.2 kg     Height:       Examination: General exam: Appears calm and comfortable  Respiratory system: Crackles left lower base, respiratory effort normal. On 2L O2  Cardiovascular system: S1 & S2  heard, RRR. No pedal edema. Gastrointestinal system: Abdomen is nondistended, soft  Central nervous system: Alert and oriented Extremities: Symmetric in appearance bilaterally  Psychiatry: Judgement and insight appear stable. Mood & affect appropriate.    Data Reviewed:  Potassium 3.7, creatinine 1.63, magnesium 1.6  Family Communication: Spouse at bedside  Disposition: Status is: Inpatient  Remains inpatient appropriate because: Replace electrolytes, continue Lasix today, patient declining SNF placement and will need PT OT, home oxygen ordered   Planned Discharge Destination: Home with Home Health      Author: Dessa Phi, DO 05/24/2021 10:33 AM  For on call review [email or web address].

## 2021-05-24 NOTE — Plan of Care (Signed)
Nutrition Education Note  RD consulted for nutrition education regarding acute on chronic CHF.  RD provided "Low Sodium Nutrition Therapy" handout from the Academy of Nutrition and Dietetics. Reviewed patient's dietary recall. Provided examples on ways to decrease sodium intake in diet. Discouraged intake of processed foods and use of salt shaker. Encouraged fresh fruits and vegetables as well as whole grain sources of carbohydrates to maximize fiber intake.   RD discussed why it is important for patient to adhere to diet recommendations, and emphasized the role of fluids, foods to avoid, and importance of weighing self daily. Teach back method used.  Expect fair compliance.  Body mass index is 40.29 kg/m.  Current diet order is Heart Healthy/Carb Modified with 2 L fluid restriction, patient is consuming approximately 50-100% of meals at this time. Labs and medications reviewed.   No further nutrition interventions warranted at this time. RD contact information provided. If additional nutrition issues arise, please re-consult RD.   Derrel Nip, RD, LDN (she/her/hers) Clinical Inpatient Dietitian RD Pager/After-Hours/Weekend Pager # in [place]

## 2021-05-24 NOTE — Assessment & Plan Note (Signed)
Patient declining SNF placement, wants to go home with home health.  TOC aware.

## 2021-05-24 NOTE — Progress Notes (Signed)
Physical Therapy Treatment Patient Details Name: Craig Black MRN: [id number] DOB: 1946/02/15 Today's Date: 05/24/2021   History of Present Illness Craig Black is a 76 y.o. male with medical history significant for COPD, diabetes mellitus, obesity, atrial fibrillation on chronic anticoagulation, OSA.  Patient presented to the ED via EMS with reports of generalized weakness, and difficulty breathing that started 3 weeks ago.  Spouse Is at bedside.  Patient's problem started about 3 weeks ago when he fell off of his car, on his right leg, he has since had pain in his right leg, he reports weakness also.  Because of this he has not been ambulating and has been lying in bed unable to walk.  He has been wearing depends and his wife has been helped pain to clean after bowel movements.  Reports poor oral intake over the past 3 weeks.  He is unaware of any lower extremity swelling.  He denies abdominal bloating.  He has had worsening of his difficulty breathing and worsening of his cough.  No chest pain.  He is on Lasix and Eliquis and reports compliance with both.    PT Comments    Pt easy going and willing to participate with therapy today.  Monitored vitals with and without O2 assistance.  Seated strengthening exercises complete in chair wihtout O2 A with vitals ranging from 87-91%.  Upon standing decreased saturation to 84% so resumed nasal cannula prior gait with ability to keep saturation at 92% during 61ft gait training with use of RW.  Pt slow and labored movements, no LOB episodes.  EOS pt returned to chair with call bell within reach and chair alarm set.     Recommendations for follow up therapy are one component of a multi-disciplinary discharge planning process, led by the attending physician.  Recommendations may be updated based on patient status, additional functional criteria and insurance authorization.  Follow Up Recommendations  Skilled nursing-short term rehab (<3 hours/day)      Assistance Recommended at Discharge Intermittent Supervision/Assistance  Patient can return home with the following A little help with bathing/dressing/bathroom;Help with stairs or ramp for entrance;Assistance with cooking/housework;A lot of help with walking and/or transfers   Equipment Recommendations  Rolling walker (2 wheels);BSC/3in1    Recommendations for Other Services       Precautions / Restrictions Precautions Precautions: Fall Restrictions Weight Bearing Restrictions: No     Mobility  Bed Mobility                    Transfers                        Ambulation/Gait                   Stairs             Wheelchair Mobility    Modified Rankin (Stroke Patients Only)       Balance                                            Cognition Arousal/Alertness: Awake/alert Behavior During Therapy: WFL for tasks assessed/performed Overall Cognitive Status: Within Functional Limits for tasks assessed  Exercises General Exercises - Lower Extremity Long Arc Quad: Strengthening, Both, 10 reps, Seated Hip Flexion/Marching: Strengthening, Both, 10 reps, Seated Toe Raises: Strengthening, Both, 10 reps, Seated Heel Raises: Strengthening, Both, 10 reps, Seated    General Comments        Pertinent Vitals/Pain Pain Assessment Pain Assessment: No/denies pain    Home Living                          Prior Function            PT Goals (current goals can now be found in the care plan section)      Frequency    Min 3X/week      PT Plan      Co-evaluation              AM-PAC PT "6 Clicks" Mobility   Outcome Measure  Help needed turning from your back to your side while in a flat bed without using bedrails?: None Help needed moving from lying on your back to sitting on the side of a flat bed without using bedrails?: A Little Help  needed moving to and from a bed to a chair (including a wheelchair)?: A Little Help needed standing up from a chair using your arms (e.g., wheelchair or bedside chair)?: A Little Help needed to walk in hospital room?: A Little Help needed climbing 3-5 steps with a railing? : A Lot 6 Click Score: 18    End of Session Equipment Utilized During Treatment: Oxygen Activity Tolerance: Patient tolerated treatment well;Patient limited by fatigue Patient left: in chair;with call bell/phone within reach;with chair alarm set Nurse Communication: Mobility status PT Visit Diagnosis: Unsteadiness on feet (R26.81);Other abnormalities of gait and mobility (R26.89);Muscle weakness (generalized) (M62.81)     Time: 0240-9735 PT Time Calculation (min) (ACUTE ONLY): 21 min  Charges:  $Therapeutic Exercise: 8-22 mins $Therapeutic Activity: 8-22 mins                     Ihor Austin, LPTA/CLT; CBIS [phone]  Aldona Lento 05/24/2021, 9:46 AM

## 2021-05-24 NOTE — TOC Progression Note (Signed)
Transition of Care [facility]) - Progression Note    Patient Details  Name: Craig Black MRN: [id number] Date of Birth: 11-10-1945  Transition of Care [facility]) CM/SW Contact  Shade Flood, LCSW Phone Number: 05/24/2021, 11:32 AM  Clinical Narrative:     TOC following. Per MD, pt anticipating dc tomorrow and pt will need Home O2 at dc. Spoke with pt to review CMS provider options and referred as requested.   Caryl Pina at [facility] aware of dc plan for tomorrow. TOC will follow.  Expected Discharge Plan: [place] Barriers to Discharge: Continued Medical Work up  Expected Discharge Plan and Services Expected Discharge Plan: [place] In-house Referral: Clinical Social Work   Post Acute Care Choice: [place] arrangements for the past 2 months: Single Family Home                 DME Arranged: Oxygen DME Agency: [facility] Date DME Agency Contacted: 05/24/21   Representative spoke with at DME Agency: Caryl Pina HH Arranged: RN, PT, Social Work [facility] Agency: [place] Date [place]: 05/23/21 Time [place]: 1246 Representative spoke with at [place]: [place] ([place]) Interventions    Readmission Risk Interventions No flowsheet data found.

## 2021-05-24 NOTE — Progress Notes (Signed)
Inpatient Diabetes Program Recommendations  AACE/ADA: New Consensus Statement on Inpatient Glycemic Control (2015)  Target Ranges:  Prepandial:   less than 140 mg/dL      Peak postprandial:   less than 180 mg/dL (1-2 hours)      Critically ill patients:  140 - 180 mg/dL   Lab Results  Component Value Date   GLUCAP 187 (H) 05/24/2021   HGBA1C 6.9 (H) 03/12/2021    Review of Glycemic Control  Latest Reference Range & Units 05/23/21 07:11 05/23/21 11:19 05/23/21 16:18 05/23/21 20:46 05/24/21 07:15  Glucose-Capillary 70 - 99 mg/dL 156 (H) 237 (H) 260 (H) 197 (H) 187 (H)  (H): Data is abnormally high  Diabetes history: DM2 Outpatient Diabetes medications: 70/30 55 units qid, Novolog 10 units tid Current orders for Inpatient glycemic control: 70/30 15 units bid, Novolog correction 0-15 units tid, 0-5 units hs  Inpatient Diabetes Program Recommendations:   -Increase 70/30 25 units am dose  Spoke with patient @ bedside and verified insulin doses which he states he takes as above listed.  Thank you, Nani Gasser. Deziree Mokry, RN, MSN, CDE  Diabetes Coordinator Inpatient Glycemic Control Team Team Pager [phone] (8am-5pm) 05/24/2021 11:07 AM

## 2021-05-25 LAB — BASIC METABOLIC PANEL
Anion gap: 7 (ref 5–15)
BUN: 29 mg/dL — ABNORMAL HIGH (ref 8–23)
CO2: 32 mmol/L (ref 22–32)
Calcium: 9.4 mg/dL (ref 8.9–10.3)
Chloride: 97 mmol/L — ABNORMAL LOW (ref 98–111)
Creatinine, Ser: 1.55 mg/dL — ABNORMAL HIGH (ref 0.61–1.24)
GFR, Estimated: 46 mL/min — ABNORMAL LOW (ref >60)
Glucose, Bld: 200 mg/dL — ABNORMAL HIGH (ref 70–99)
Potassium: 4.2 mmol/L (ref 3.5–5.1)
Sodium: 136 mmol/L (ref 135–145)

## 2021-05-25 LAB — MAGNESIUM: Magnesium: 2 mg/dL (ref 1.7–2.4)

## 2021-05-25 LAB — GLUCOSE, CAPILLARY: Glucose-Capillary: 218 mg/dL — ABNORMAL HIGH (ref 70–99)

## 2021-05-25 MED ORDER — NICOTINE 14 MG/24HR TD PT24: 14.0000 mg | MEDICATED_PATCH | Freq: Every day | TRANSDERMAL | 0 refills | Status: DC | PRN

## 2021-05-25 MED ORDER — INSULIN ASPART PROT & ASPART (70-30 MIX) 100 UNIT/ML ~~LOC~~ SUSP: 25.0000 [IU] | Freq: Two times a day (BID) | SUBCUTANEOUS | 0 refills | Status: AC

## 2021-05-25 NOTE — Discharge Summary (Addendum)
Physician Discharge Summary   Patient: Craig Black MRN: [id number] DOB: 1945-06-29  Admit date:     05/21/2021  Discharge date: 05/25/21  Discharge Physician: Dessa Phi   PCP: Carrolyn Meiers, MD   Recommendations at discharge:   Follow-up closely with primary care physician  Discharge Diagnoses: Principal Problem:   Acute on chronic diastolic CHF (congestive heart failure) ([place]) Active Problems:   Acute respiratory failure with hypoxia (HCC)   Hypertension   Paroxysmal A-fib (HCC)   Diabetes mellitus (HCC)   CKD (chronic kidney disease) stage 3, GFR 30-59 ml/min (HCC)   OSA (obstructive sleep apnea)   COPD (chronic obstructive pulmonary disease) (HCC)   Tobacco abuse   Obesity, Class III, BMI 40-49.9 (morbid obesity) ([place])   Physical deconditioning  Resolved Problems:   Hypokalemia   Hypomagnesemia        Hospital Course: Craig Black is a 76 y.o. male with medical history significant for COPD, diabetes mellitus, obesity, atrial fibrillation on chronic anticoagulation, OSA.  Patient presented to the ED via EMS with reports of generalized weakness, and difficulty breathing that started 3 weeks ago.  Spouse is at bedside.  Patient's problem started about 3 weeks ago when he fell off of his car, on his right leg, he has since had pain in his right leg, he reports weakness also.  Because of this he has not been ambulating and has been lying in bed unable to walk.  He has been wearing depends and his wife has been helping to clean after bowel movements.  He reports poor oral intake over the past 3 weeks.  He is on Lasix and Eliquis and reports compliance with both. He apparently has been bedbound at home for last 3 weeks after a fall at home.  Patient was admitted with acute diastolic heart failure and started on IV Lasix.  2/8: Patient wanting to go home.  He was on 8 L high flow nasal cannula O2 which was weaned down to 6 L prior to my examination.  Urine output  recorded 1800 mL last 24 hours.  His IV access was lost this morning, can change Lasix to p.o.  He remains very weak and will need PT evaluation prior to discharge for dispo planning.  Replace potassium today.  2/9: Patient was recommended for SNF by PT.  Patient is declining SNF placement and wants to go home with home health.  He really wants to go home today.  He was weaned down to 2 L oxygen, but was unable to wean down any further due to desaturation.  Urine output is recorded 1700 mL +3 unrecorded outputs.  We discussed the importance of adherence to low-sodium, fluid restriction diet  2/10 day of discharge: Patient feeling well.  He remains on 2 L oxygen.  Urine output recorded as 3067m in the last 24 hours.  Patient has been diuresing well on p.o. Lasix.  He met with dietitian for heart healthy diet and sodium restriction, fluid restriction diet recommendations.  Patient feeling ready to discharge home with home health.  Assessment and Plan: * Acute on chronic diastolic CHF (congestive heart failure) ([place]- (present on admission) Echocardiogram revealed EF 60% Continue p.o. Lasix Strict I's and O's, daily weights Heart healthy, fluid restriction diet  Acute respiratory failure with hypoxia ([place]- (present on admission) Secondary to CHF Weaned down, now on 2 L Home oxygen desat screen complete, ordered DME O2 for discharge  CKD (chronic kidney disease) stage 3, GFR 30-59 ml/min (  [place])- (present on admission) CKD stage IIIa, GFR 53 Baseline creatinine ranging 1.6-2.4 (2019-2022) Stable  Diabetes mellitus (HCC) Continue novolog 70/30 and SSI   Paroxysmal A-fib ([place])- (present on admission) Continue carvedilol, eliquis   Hypertension- (present on admission) Continue Norvasc, carvedilol  Physical deconditioning Patient declining SNF placement, wants to go home with home health.  TOC aware.  Obesity, Class III, BMI 40-49.9 (morbid obesity) ([place]) Estimated body mass index is  40.64 kg/m as calculated from the following:   Height as of this encounter: '5\' 6"'  (1.676 m).   Weight as of this encounter: 114.2 kg.   Tobacco abuse- (present on admission) Counseled on cessation Advised patient that he must not smoke while wearing oxygen  COPD (chronic obstructive pulmonary disease) ([place])- (present on admission) Continue bronchodilators as ordered Strongly advised tobacco cessation   OSA (obstructive sleep apnea)- (present on admission) Continue nightly CPAP as ordered           Consultants: None Procedures performed: None  Disposition: Home health Diet recommendation:  Cardiac and Carb modified diet  DISCHARGE MEDICATION: Allergies as of 05/25/2021   No Known Allergies      Medication List     STOP taking these medications    ondansetron 4 MG disintegrating tablet Commonly known as: Zofran ODT   tamsulosin 0.4 MG Caps capsule Commonly known as: Flomax       TAKE these medications    albuterol 108 (90 Base) MCG/ACT inhaler Commonly known as: VENTOLIN HFA Inhale 2 puffs into the lungs every 6 (six) hours as needed for wheezing or shortness of breath.   amLODipine 10 MG tablet Commonly known as: NORVASC Take 10 mg by mouth daily.   apixaban 5 MG Tabs tablet Commonly known as: ELIQUIS Take 5 mg by mouth 2 (two) times daily.   calcitRIOL 0.25 MCG capsule Commonly known as: ROCALTROL Take 0.25 mcg by mouth 3 (three) times a week.   carvedilol 6.25 MG tablet Commonly known as: COREG Take 6.25 mg by mouth 2 (two) times daily.   furosemide 20 MG tablet Commonly known as: LASIX Take 40 mg by mouth daily.   insulin aspart protamine- aspart (70-30) 100 UNIT/ML injection Commonly known as: NOVOLOG MIX 70/30 Inject 0.25 mLs (25 Units total) into the skin 2 (two) times daily with a meal. What changed:  how much to take when to take this reasons to take this   loratadine 10 MG tablet Commonly known as: CLARITIN Take 10 mg by  mouth daily.   nicotine 14 mg/24hr patch Commonly known as: NICODERM CQ - dosed in mg/24 hours Place 1 patch (14 mg total) onto the skin daily as needed (nicotine craving).   NOVOLOG IJ Inject 10 Units as directed 3 (three) times daily as needed (sugar). If blood sugar is high he will add the 10 units to the 55 units of novolog 70/30 55 units   Trelegy Ellipta 100-62.5-25 MCG/ACT Aepb Generic drug: Fluticasone-Umeclidin-Vilant Inhale 1 puff into the lungs daily.               Durable Medical Equipment  (From admission, onward)           Start     Ordered   05/24/21 1144  For home use only DME oxygen  Once       Question Answer Comment  Length of Need 6 Months   Mode or (Route) Nasal cannula   Liters per Minute 2   Frequency Continuous (stationary and portable oxygen unit needed)  Oxygen delivery system Gas      05/24/21 1143            Follow-up Information     Fanta, Normajean Baxter, MD. Schedule an appointment as soon as possible for a visit in 1 week(s).   Specialty: Internal Medicine Contact information: [place] [address] [phone]                 Discharge Exam: Filed Weights   05/23/21 0523 05/24/21 0500 05/25/21 0444  Weight: 114.2 kg 113.2 kg 112 kg   Examination: General exam: Appears calm and comfortable  Respiratory system: Clear to auscultation. Respiratory effort normal. On 2L O2 without conversational dyspnea  Cardiovascular system: S1 & S2 heard, RRR. No pedal edema. Gastrointestinal system: Abdomen is nondistended, soft and nontender. Normal bowel sounds heard. Central nervous system: Alert and oriented. Non focal exam. Speech clear  Extremities: Symmetric in appearance bilaterally  Skin: No rashes, lesions or ulcers on exposed skin  Psychiatry: Judgement and insight appear stable. Mood & affect appropriate.    Condition at discharge: good  The results of significant diagnostics from this  hospitalization (including imaging, microbiology, ancillary and laboratory) are listed below for reference.   Imaging Studies: DG Chest 1 View  Result Date: 05/21/2021 CLINICAL DATA:  Recent fall, history of COPD EXAM: CHEST  1 VIEW COMPARISON:  Chest radiograph 03/12/2021 FINDINGS: Mildly enlarged cardiac silhouette, likely accentuated by technique. Layering moderate-sized right and small left pleural effusions with adjacent atelectasis. No pneumothorax. No acute osseous abnormality. IMPRESSION: Moderate right and small left pleural effusions with adjacent atelectasis. Electronically Signed   By: Maurine Simmering M.D.   On: 05/21/2021 18:39   CT HEAD WO CONTRAST (5MM)  Result Date: 05/21/2021 CLINICAL DATA:  Head trauma, moderate-severe EXAM: CT HEAD WITHOUT CONTRAST TECHNIQUE: Contiguous axial images were obtained from the base of the skull through the vertex without intravenous contrast. RADIATION DOSE REDUCTION: This exam was performed according to the departmental dose-optimization program which includes automated exposure control, adjustment of the mA and/or kV according to patient size and/or use of iterative reconstruction technique. COMPARISON:  CT 11/14/2017 FINDINGS: Brain: No evidence of acute intracranial hemorrhage or extra-axial collection.No evidence of mass lesion/concern mass effect.The ventricles are normal in size.Scattered subcortical and periventricular white matter hypodensities, nonspecific but likely sequela of chronic small vessel ischemic disease.Mild cerebral atrophy Vascular: No hyperdense vessel or unexpected calcification. Skull: Normal. Negative for fracture or focal lesion. Sinuses/Orbits: No acute finding. Other: Right mastoid effusion. IMPRESSION: No acute intracranial abnormality.  Right mastoid effusion. Mild sequela of chronic small vessel ischemic disease. Electronically Signed   By: Maurine Simmering M.D.   On: 05/21/2021 18:43   CT Angio Chest PE W and/or Wo Contrast  Result  Date: 05/21/2021 CLINICAL DATA:  Pulmonary embolism (PE) suspected, high prob EXAM: CT ANGIOGRAPHY CHEST WITH CONTRAST TECHNIQUE: Multidetector CT imaging of the chest was performed using the standard protocol during bolus administration of intravenous contrast. Multiplanar CT image reconstructions and MIPs were obtained to evaluate the vascular anatomy. RADIATION DOSE REDUCTION: This exam was performed according to the departmental dose-optimization program which includes automated exposure control, adjustment of the mA and/or kV according to patient size and/or use of iterative reconstruction technique. CONTRAST:  33m OMNIPAQUE IOHEXOL 350 MG/ML SOLN COMPARISON:  Same day radiograph FINDINGS: Cardiovascular: Normal cardiac size.Trace pericardial effusion. Coronary artery calcifications.No evidence of pulmonary embolism. Normal size main and branch pulmonary arteries.Moderate atherosclerotic calcifications of the aortic arch and descending  aorta. Common origin of the brachiocephalic and left common carotid arteries. Mediastinum/Nodes: There are few prominent mediastinal lymph nodes, likely reactive.No axillary adenopathy.The thyroid is unremarkable.Esophagus is unremarkable. Lungs/Pleura: The central airways are patent. There is mild bronchial wall thickening.Moderate right and small left pleural effusions with adjacent atelectasis.No suspicious pulmonary nodules or masses. Mild centrilobular emphysema, mid apical predominant. There is no pneumothorax.No visible suspicious pulmonary nodule or mass. Upper Abdomen: No acute abnormality. Musculoskeletal: No acute osseous abnormality.No suspicious lytic or blastic lesions. Multilevel degenerative changes of the spine. Review of the MIP images confirms the above findings. IMPRESSION: Moderate right and small left pleural effusions with adjacent atelectasis. Trace pericardial effusion. No evidence of pulmonary embolism. Aortic Atherosclerosis (ICD10-I70.0) and Emphysema  (ICD10-J43.9). Electronically Signed   By: Maurine Simmering M.D.   On: 05/21/2021 18:48   DG Knee Complete 4 Views Right  Result Date: 05/22/2021 CLINICAL DATA:  Pain following fall EXAM: RIGHT KNEE - COMPLETE 4 VIEW COMPARISON:  None. FINDINGS: No acute fracture or dislocation. Small joint effusion. Mild tricompartmental degenerative changes. Vascular calcifications. IMPRESSION: No acute osseous abnormality. Electronically Signed   By: Yetta Glassman M.D.   On: 05/22/2021 08:15   ECHOCARDIOGRAM COMPLETE  Result Date: 05/22/2021    ECHOCARDIOGRAM REPORT   Patient Name:   DUANTE AROCHO [facility] Date of Exam: 05/22/2021 Medical Rec #:  [id number]     Height:       66.0 in Accession #:    [id number]    Weight:       257.5 lb Date of Birth:  03/14/1946     BSA:          2.227 m Patient Age:    21 years      BP:           144/65 mmHg Patient Gender: M             HR:           84 bpm. Exam Location:  [name] Procedure: 2D Echo, Cardiac Doppler and Color Doppler Indications:    CHF-Acute Diastolic M08.02  History:        Patient has prior history of Echocardiogram examinations, most                 recent 10/29/2019. COPD, Arrythmias:Atrial Fibrillation; Risk                 Factors:Hypertension, Diabetes and Current Smoker. OSA                 (obstructive sleep apnea), Obesity.  Sonographer:    Alvino Chapel RCS Referring Phys: [id number] [place]  1. Left ventricular ejection fraction, by estimation, is 60 to 65%. The left ventricle has normal function. The left ventricle has no regional wall motion abnormalities. There is moderate concentric left ventricular hypertrophy. Left ventricular diastolic parameters are indeterminate.  2. Right ventricular systolic function is normal. The right ventricular size is normal. Tricuspid regurgitation signal is inadequate for assessing PA pressure.  3. Left atrial size was severely dilated.  4. Right atrial size was severely dilated.  5. The mitral valve is grossly  normal. No evidence of mitral valve regurgitation. No evidence of mitral stenosis.  6. The aortic valve was not well visualized. Aortic valve regurgitation is not visualized. No aortic stenosis is present.  7. The inferior vena cava is dilated in size with >50% respiratory variability, suggesting right atrial pressure of 8 mmHg. Comparison(s): Further atrial dilation from prior.  FINDINGS  Left Ventricle: Left ventricular ejection fraction, by estimation, is 60 to 65%. The left ventricle has normal function. The left ventricle has no regional wall motion abnormalities. Definity contrast agent was given IV to delineate the left ventricular  endocardial borders. The left ventricular internal cavity size was normal in size. There is moderate concentric left ventricular hypertrophy. Left ventricular diastolic parameters are indeterminate. Right Ventricle: The right ventricular size is normal. No increase in right ventricular wall thickness. Right ventricular systolic function is normal. Tricuspid regurgitation signal is inadequate for assessing PA pressure. Left Atrium: Left atrial size was severely dilated. Right Atrium: Right atrial size was severely dilated. Pericardium: There is no evidence of pericardial effusion. Presence of epicardial fat layer. Mitral Valve: The mitral valve is grossly normal. No evidence of mitral valve regurgitation. No evidence of mitral valve stenosis. Tricuspid Valve: The tricuspid valve is grossly normal. Tricuspid valve regurgitation is not demonstrated. No evidence of tricuspid stenosis. Aortic Valve: The aortic valve was not well visualized. Aortic valve regurgitation is not visualized. No aortic stenosis is present. Pulmonic Valve: The pulmonic valve was not well visualized. Pulmonic valve regurgitation is not visualized. No evidence of pulmonic stenosis. Aorta: The aortic root is normal in size and structure. Venous: The inferior vena cava is dilated in size with greater than 50%  respiratory variability, suggesting right atrial pressure of 8 mmHg. IAS/Shunts: No atrial level shunt detected by color flow Doppler.  LEFT VENTRICLE PLAX 2D LVIDd:         4.40 cm LVIDs:         2.60 cm LV PW:         1.30 cm LV IVS:        1.40 cm LVOT diam:     2.00 cm LV SV:         64 LV SV Index:   29 LVOT Area:     3.14 cm  RIGHT VENTRICLE TAPSE (M-mode): 2.1 cm LEFT ATRIUM              Index        RIGHT ATRIUM           Index LA diam:        4.20 cm  1.89 cm/m   RA Area:     29.15 cm LA Vol (A2C):   99.9 ml  44.87 ml/m  RA Volume:   109.50 ml 49.18 ml/m LA Vol (A4C):   139.0 ml 62.43 ml/m LA Biplane Vol: 117.0 ml 52.55 ml/m  AORTIC VALVE LVOT Vmax:   83.25 cm/s LVOT Vmean:  61.400 cm/s LVOT VTI:    0.203 m  AORTA Ao Root diam: 3.60 cm MITRAL VALVE MV Area (PHT): 3.42 cm     SHUNTS MV Decel Time: 222 msec     Systemic VTI:  0.20 m MV E velocity: 122.00 cm/s  Systemic Diam: 2.00 cm Rudean Haskell MD Electronically signed by Rudean Haskell MD Signature Date/Time: 05/22/2021/4:58:08 PM    Final    DG Hip Unilat W or Wo Pelvis 2-3 Views Right  Result Date: 05/21/2021 CLINICAL DATA:  Weakness, recent fall EXAM: DG HIP (WITH OR WITHOUT PELVIS) 2-3V RIGHT COMPARISON:  None. FINDINGS: There is no evidence of acute fracture. There is mild bilateral hip osteoarthritis. Pelvic phleboliths. Vascular calcifications. IMPRESSION: No evidence of acute right hip fracture. Electronically Signed   By: Maurine Simmering M.D.   On: 05/21/2021 18:40    Microbiology: Results for orders placed or performed during the  hospital encounter of 05/21/21  Resp Panel by RT-PCR (Flu A&B, Covid) Nasopharyngeal Swab     Status: None   Collection Time: 05/21/21  4:40 PM   Specimen: Nasopharyngeal Swab; Nasopharyngeal(NP) swabs in vial transport medium  Result Value Ref Range Status   SARS Coronavirus 2 by RT PCR NEGATIVE NEGATIVE Final    Comment: (NOTE) SARS-CoV-2 target nucleic acids are NOT DETECTED.  The  SARS-CoV-2 RNA is generally detectable in upper respiratory specimens during the acute phase of infection. The lowest concentration of SARS-CoV-2 viral copies this assay can detect is 138 copies/mL. A negative result does not preclude SARS-Cov-2 infection and should not be used as the sole basis for treatment or other patient management decisions. A negative result may occur with  improper specimen collection/handling, submission of specimen other than nasopharyngeal swab, presence of viral mutation(s) within the areas targeted by this assay, and inadequate number of viral copies(<138 copies/mL). A negative result must be combined with clinical observations, patient history, and epidemiological information. The expected result is Negative.  Fact Sheet for Patients:  EntrepreneurPulse.com.au  Fact Sheet for Healthcare Providers:  IncredibleEmployment.be  This test is no t yet approved or cleared by the [place] FDA and  has been authorized for detection and/or diagnosis of SARS-CoV-2 by FDA under an Emergency Use Authorization (EUA). This EUA will remain  in effect (meaning this test can be used) for the duration of the COVID-19 declaration under Section 564(b)(1) of the Act, 21 U.S.C.section 360bbb-3(b)(1), unless the authorization is terminated  or revoked sooner.       Influenza A by PCR NEGATIVE NEGATIVE Final   Influenza B by PCR NEGATIVE NEGATIVE Final    Comment: (NOTE) The Xpert Xpress SARS-CoV-2/FLU/RSV plus assay is intended as an aid in the diagnosis of influenza from Nasopharyngeal swab specimens and should not be used as a sole basis for treatment. Nasal washings and aspirates are unacceptable for Xpert Xpress SARS-CoV-2/FLU/RSV testing.  Fact Sheet for Patients: EntrepreneurPulse.com.au  Fact Sheet for Healthcare Providers: IncredibleEmployment.be  This test is not yet approved or  cleared by the [place] FDA and has been authorized for detection and/or diagnosis of SARS-CoV-2 by FDA under an Emergency Use Authorization (EUA). This EUA will remain in effect (meaning this test can be used) for the duration of the COVID-19 declaration under Section 564(b)(1) of the Act, 21 U.S.C. section 360bbb-3(b)(1), unless the authorization is terminated or revoked.  Performed at [facility], [address], [place], [place] [address]     Labs: CBC: Recent Labs  Lab 05/21/21 1645  WBC 8.0  NEUTROABS 6.6  HGB 15.3  HCT 48.5  MCV 99.0  PLT 672   Basic Metabolic Panel: Recent Labs  Lab 05/21/21 1645 05/21/21 1846 05/22/21 0355 05/23/21 0439 05/24/21 0456 05/25/21 0449  NA 138  --  138 138 138 136  K 3.8  --  3.5 3.2* 3.7 4.2  CL 98  --  100 99 97* 97*  CO2 28  --  31 32 33* 32  GLUCOSE 216*  --  167* 136* 193* 200*  BUN 29*  --  25* 25* 29* 29*  CREATININE 1.48*  --  1.36* 1.37* 1.63* 1.55*  CALCIUM 9.1  --  8.6* 8.7* 9.0 9.4  MG  --  1.9  --  1.7 1.6* 2.0   Liver Function Tests: Recent Labs  Lab 05/21/21 1645  AST 14*  ALT 11  ALKPHOS 78  BILITOT 1.0  PROT 7.2  ALBUMIN 3.4*  CBG: Recent Labs  Lab 05/24/21 0715 05/24/21 1102 05/24/21 1713 05/24/21 2116 05/25/21 0752  GLUCAP 187* 263* 208* 226* 218*    Discharge time spent: less than 30 minutes.  Signed: Dessa Phi, DO [facility] 05/25/2021

## 2021-05-25 NOTE — TOC Transition Note (Signed)
Transition of Care [facility]) - CM/SW Discharge Note   Patient Details  Name: Craig Black MRN: [id number] Date of Birth: [birth year]  [facility] of [facility] CM/SW Contact:  Shade Flood, LCSW Phone Number: 05/25/2021, 9:36 AM   Clinical Narrative:     Pt has orders for dc today. Plan remains for dc home with [place] and Home O2. Updated Marjory Lies at [facility]. Caryl Pina at [employer] that the portable O2 for dc has been delivered to pt's room and they have made arrangements with pt's sig other for the home set up later today. Anticipating family transport home.  Updated RN. There are no other TOC needs for dc.  Final next level of care: [place] Barriers to Discharge: Barriers Resolved   Patient Goals and CMS Choice Patient states their goals for this hospitalization and ongoing recovery are:: return home CMS Medicare.gov Compare Post Acute Care list provided to:: Patient Choice offered to / list presented to : Patient  Discharge Placement                       Discharge Plan and Services In-house Referral: Clinical Social Work   Post Acute Care Choice: Home Health          DME Arranged: Oxygen DME Agency: [employer] Date DME Agency Contacted: 05/24/21   Representative spoke with at DME Agency: Caryl Pina HH Arranged: RN, PT, Social Work [facility] Agency: [place] Date [place]: 05/23/21 Time [place]: 1246 Representative spoke with at [place]: [place] ([place]) Interventions     Readmission Risk Interventions No flowsheet data found.

## 2021-05-25 NOTE — Progress Notes (Signed)
Nsg Discharge Note  Admit Date:  05/21/2021 Discharge date: 05/25/2021   Craig Black to be D/C'd Home per MD order.  AVS completed.   Patient/caregiver able to verbalize understanding.  Discharge Medication: Allergies as of 05/25/2021   No Known Allergies      Medication List     STOP taking these medications    ondansetron 4 MG disintegrating tablet Commonly known as: Zofran ODT   tamsulosin 0.4 MG Caps capsule Commonly known as: Flomax       TAKE these medications    albuterol 108 (90 Base) MCG/ACT inhaler Commonly known as: VENTOLIN HFA Inhale 2 puffs into the lungs every 6 (six) hours as needed for wheezing or shortness of breath.   amLODipine 10 MG tablet Commonly known as: NORVASC Take 10 mg by mouth daily.   apixaban 5 MG Tabs tablet Commonly known as: ELIQUIS Take 5 mg by mouth 2 (two) times daily.   calcitRIOL 0.25 MCG capsule Commonly known as: ROCALTROL Take 0.25 mcg by mouth 3 (three) times a week.   carvedilol 6.25 MG tablet Commonly known as: COREG Take 6.25 mg by mouth 2 (two) times daily.   furosemide 20 MG tablet Commonly known as: LASIX Take 40 mg by mouth daily.   insulin aspart protamine- aspart (70-30) 100 UNIT/ML injection Commonly known as: NOVOLOG MIX 70/30 Inject 0.25 mLs (25 Units total) into the skin 2 (two) times daily with a meal. What changed:  how much to take when to take this reasons to take this   loratadine 10 MG tablet Commonly known as: CLARITIN Take 10 mg by mouth daily.   nicotine 14 mg/24hr patch Commonly known as: NICODERM CQ - dosed in mg/24 hours Place 1 patch (14 mg total) onto the skin daily as needed (nicotine craving).   NOVOLOG IJ Inject 10 Units as directed 3 (three) times daily as needed (sugar). If blood sugar is high he will add the 10 units to the 55 units of novolog 70/30 55 units   Trelegy Ellipta 100-62.5-25 MCG/ACT Aepb Generic drug: Fluticasone-Umeclidin-Vilant Inhale 1 puff into the  lungs daily.               Durable Medical Equipment  (From admission, onward)           Start     Ordered   05/24/21 1144  For home use only DME oxygen  Once       Question Answer Comment  Length of Need 6 Months   Mode or (Route) Nasal cannula   Liters per Minute 2   Frequency Continuous (stationary and portable oxygen unit needed)   Oxygen delivery system Gas      05/24/21 1143            Discharge Assessment: Vitals:   05/25/21 0444 05/25/21 0817  BP: (!) 115/52   Pulse: 96   Resp: 19   Temp: 97.7 F (36.5 C)   SpO2: 93% 94%   Skin clean, dry and intact without evidence of skin break down, no evidence of skin tears noted. IV catheter discontinued intact. Site without signs and symptoms of complications - no redness or edema noted at insertion site, patient denies c/o pain - only slight tenderness at site.  Dressing with slight pressure applied.  D/c Instructions-Education: Discharge instructions given to patient/family with verbalized understanding. D/c education completed with patient/family including follow up instructions, medication list, d/c activities limitations if indicated, with other d/c instructions as indicated by MD - patient  able to verbalize understanding, all questions fully answered. Patient instructed to return to ED, call 911, or call MD for any changes in condition.  Patient escorted via [place], and D/C home via private auto.  Kathie Rhodes, RN 05/25/2021 10:08 AM

## 2021-05-31 DIAGNOSIS — I4821 Permanent atrial fibrillation: Secondary | ICD-10-CM | POA: Diagnosis not present

## 2021-05-31 DIAGNOSIS — J9600 Acute respiratory failure, unspecified whether with hypoxia or hypercapnia: Secondary | ICD-10-CM | POA: Diagnosis not present

## 2021-05-31 DIAGNOSIS — E1165 Type 2 diabetes mellitus with hyperglycemia: Secondary | ICD-10-CM | POA: Diagnosis not present

## 2021-05-31 DIAGNOSIS — I5033 Acute on chronic diastolic (congestive) heart failure: Secondary | ICD-10-CM | POA: Diagnosis not present

## 2021-06-05 DIAGNOSIS — R809 Proteinuria, unspecified: Secondary | ICD-10-CM | POA: Diagnosis not present

## 2021-06-05 DIAGNOSIS — N189 Chronic kidney disease, unspecified: Secondary | ICD-10-CM | POA: Diagnosis not present

## 2021-06-05 DIAGNOSIS — N170 Acute kidney failure with tubular necrosis: Secondary | ICD-10-CM | POA: Diagnosis not present

## 2021-06-05 DIAGNOSIS — E1129 Type 2 diabetes mellitus with other diabetic kidney complication: Secondary | ICD-10-CM | POA: Diagnosis not present

## 2021-06-05 DIAGNOSIS — E1122 Type 2 diabetes mellitus with diabetic chronic kidney disease: Secondary | ICD-10-CM | POA: Diagnosis not present

## 2021-06-05 DIAGNOSIS — E211 Secondary hyperparathyroidism, not elsewhere classified: Secondary | ICD-10-CM | POA: Diagnosis not present

## 2021-06-05 DIAGNOSIS — I129 Hypertensive chronic kidney disease with stage 1 through stage 4 chronic kidney disease, or unspecified chronic kidney disease: Secondary | ICD-10-CM | POA: Diagnosis not present

## 2021-06-13 DIAGNOSIS — E1122 Type 2 diabetes mellitus with diabetic chronic kidney disease: Secondary | ICD-10-CM | POA: Diagnosis not present

## 2021-06-13 DIAGNOSIS — I129 Hypertensive chronic kidney disease with stage 1 through stage 4 chronic kidney disease, or unspecified chronic kidney disease: Secondary | ICD-10-CM | POA: Diagnosis not present

## 2021-06-13 DIAGNOSIS — N189 Chronic kidney disease, unspecified: Secondary | ICD-10-CM | POA: Diagnosis not present

## 2021-06-13 DIAGNOSIS — E211 Secondary hyperparathyroidism, not elsewhere classified: Secondary | ICD-10-CM | POA: Diagnosis not present

## 2021-06-13 DIAGNOSIS — E1129 Type 2 diabetes mellitus with other diabetic kidney complication: Secondary | ICD-10-CM | POA: Diagnosis not present

## 2021-06-13 DIAGNOSIS — N400 Enlarged prostate without lower urinary tract symptoms: Secondary | ICD-10-CM | POA: Diagnosis not present

## 2021-06-13 DIAGNOSIS — R809 Proteinuria, unspecified: Secondary | ICD-10-CM | POA: Diagnosis not present

## 2021-06-19 DIAGNOSIS — I10 Essential (primary) hypertension: Secondary | ICD-10-CM | POA: Diagnosis not present

## 2021-06-19 DIAGNOSIS — E1165 Type 2 diabetes mellitus with hyperglycemia: Secondary | ICD-10-CM | POA: Diagnosis not present

## 2021-06-28 DIAGNOSIS — E1165 Type 2 diabetes mellitus with hyperglycemia: Secondary | ICD-10-CM | POA: Diagnosis not present

## 2021-06-28 DIAGNOSIS — I10 Essential (primary) hypertension: Secondary | ICD-10-CM | POA: Diagnosis not present

## 2021-07-13 ENCOUNTER — Ambulatory Visit (INDEPENDENT_AMBULATORY_CARE_PROVIDER_SITE_OTHER): Payer: Medicare HMO | Admitting: Internal Medicine

## 2021-07-13 ENCOUNTER — Encounter: Payer: Self-pay | Admitting: Internal Medicine

## 2021-07-13 VITALS — BP 142/68 | HR 63 | Ht 66.0 in | Wt 240.0 lb

## 2021-07-13 DIAGNOSIS — I4821 Permanent atrial fibrillation: Secondary | ICD-10-CM | POA: Diagnosis not present

## 2021-07-13 DIAGNOSIS — I10 Essential (primary) hypertension: Secondary | ICD-10-CM

## 2021-07-13 DIAGNOSIS — J449 Chronic obstructive pulmonary disease, unspecified: Secondary | ICD-10-CM

## 2021-07-13 DIAGNOSIS — N1831 Chronic kidney disease, stage 3a: Secondary | ICD-10-CM

## 2021-07-13 DIAGNOSIS — I5033 Acute on chronic diastolic (congestive) heart failure: Secondary | ICD-10-CM

## 2021-07-13 DIAGNOSIS — Z720 Tobacco use: Secondary | ICD-10-CM

## 2021-07-13 NOTE — Patient Instructions (Signed)
Follow-Up: ?Follow up with Dr. Gasper Sells  in the fall of 2023. ? ?Any Other Special Instructions Will Be Listed Below (If Applicable). ? ? ? ? ?If you need a refill on your cardiac medications before your next appointment, please call your pharmacy. ? ?

## 2021-07-13 NOTE — Progress Notes (Signed)
?Cardiology Office Note:   ? ?Date:  07/13/2021  ? ?ID:  Craig Black, DOB 01-Apr-1946, MRN [id number] ? ?PCP:  Craig Meiers, MD ?  ?[facility] Providers ?Cardiologist:  None    ? ?Referring MD: Craig Black*  ? ?CC: Not sure why he is here ?Consulted for the evaluation of SOB at the behest of Craig Black, Craig Baxter, MD ? ?History of Present Illness:   ? ?Craig Black is a 76 y.o. male with a hx of Long standing persistent AF, HTN with DM, HFpEF, OSA , COPD on 2 L O2, and tobacco abuse, HLD with DM and CKD stage IIIB seen by Dr. Theador Black. ?Echo 05/2021 with Severe biatrial dilation  ? ?Patient notes that he is feeling fine.  Has had no chest pain, chest pressure, chest tightness, chest stinging.  Patient exertion notable for minimal (he notes he is largely a couch potato).  No shortness of breath. He notes that he wears his O2 all the time now but he doesn't like lugging it around. No PND or orthopnea.  Notes that he ate fatback today and that may explain elevated BP.  No syncope or near syncope . Notes  no palpitations or funny heart beats.   He is asymptomatic from AF.  20 years ago has palpitations from it. ? ?He has smoked since 76 year old.  Is pre-contemplative to quit. ? ? ? ?Past Medical History:  ?Diagnosis Date  ? Arthritis   ? Chronic kidney disease   ? Chronic sinusitis   ? COPD (chronic obstructive pulmonary disease) ([place])   ? Diabetes mellitus   ? Gout   ? Hard of hearing   ? Right ear  ? Hyperchloremia   ? Hypertension   ? Murmur   ? Neuropathy in diabetes [facility])   ? Obesity   ? OSA (obstructive sleep apnea)   ? Paroxysmal A-fib ([place])   ? Tobacco abuse   ? ? ?Past Surgical History:  ?Procedure Laterality Date  ? APPENDECTOMY    ? cataract    ? right lens replacement  ? EXTRACORPOREAL SHOCK WAVE LITHOTRIPSY Left 12/21/2019  ? Procedure: EXTRACORPOREAL SHOCK WAVE LITHOTRIPSY (ESWL);  Surgeon: Cleon Gustin, MD;  Location: [facility];  Service: Urology;  Laterality: Left;  ? HERNIA  REPAIR    ? SINUS EXPLORATION    ? 1998  ? ? ?Current Medications: ?Current Meds  ?Medication Sig  ? albuterol (VENTOLIN HFA) 108 (90 Base) MCG/ACT inhaler Inhale 2 puffs into the lungs every 6 (six) hours as needed for wheezing or shortness of breath.  ? amLODipine (NORVASC) 10 MG tablet Take 10 mg by mouth daily.  ? apixaban (ELIQUIS) 5 MG TABS tablet Take 5 mg by mouth 2 (two) times daily.  ? calcitRIOL (ROCALTROL) 0.25 MCG capsule Take 0.25 mcg by mouth 3 (three) times a week.  ? carvedilol (COREG) 6.25 MG tablet Take 6.25 mg by mouth 2 (two) times daily.  ? furosemide (LASIX) 20 MG tablet Take 40 mg by mouth daily.  ? Insulin Aspart (NOVOLOG IJ) Inject 10 Units as directed 3 (three) times daily as needed (sugar). If blood sugar is high he will add the 10 units to the 55 units of novolog 70/30 55 units  ? insulin aspart protamine- aspart (NOVOLOG MIX 70/30) (70-30) 100 UNIT/ML injection Inject 0.25 mLs (25 Units total) into the skin 2 (two) times daily with a meal.  ? loratadine (CLARITIN) 10 MG tablet Take 10 mg by mouth daily.  ?  TRELEGY ELLIPTA 100-62.5-25 MCG/ACT AEPB Inhale 1 puff into the lungs daily.  ?  ? ?Allergies:   Patient has no known allergies.  ? ?Social History  ? ?Socioeconomic History  ? Marital status: Widowed  ?  Spouse name: Not on file  ? Number of children: Not on file  ? Years of education: Not on file  ? Highest education level: Not on file  ?Occupational History  ? Occupation: retired  ?Tobacco Use  ? Smoking status: Every Day  ?  Types: Cigars  ? Smokeless tobacco: Never  ? Tobacco comments:  ?  3 cigars daily   ?Vaping Use  ? Vaping Use: Never used  ?Substance and Sexual Activity  ? Alcohol use: Not Currently  ? Drug use: No  ? Sexual activity: Not on file  ?Other Topics Concern  ? Not on file  ?Social History Narrative  ? Not on file  ? ?Social Determinants of Health  ? ?Financial Resource Strain: Not on file  ?Food Insecurity: Not on file  ?Transportation Needs: Not on file   ?Physical Activity: Not on file  ?Stress: Not on file  ?Social Connections: Not on file  ?  ? ?Family History: ?The patient's family history is not on file. He was adopted. ? ?ROS:   ?Please see the history of present illness.    ? All other systems reviewed and are negative. ? ?EKGs/Labs/Other Studies Reviewed:   ? ? ?Recent Labs: ?05/21/2021: ALT 11; Hemoglobin 15.3; Platelets 247; TSH 1.229 ?05/23/2021: B Natriuretic Peptide 110.0 ?05/25/2021: BUN 29; Creatinine, Ser 1.55; Magnesium 2.0; Potassium 4.2; Sodium 136  ?Recent Lipid Panel ?   ?Component Value Date/Time  ? CHOL 98 03/12/2021 1602  ? TRIG 134 03/12/2021 1602  ? HDL 38 (L) 03/12/2021 1602  ? CHOLHDL 2.6 03/12/2021 1602  ? VLDL 27 03/12/2021 1602  ? [place] 33 03/12/2021 1602  ? ?    ? ?Physical Exam:   ? ?VS:  BP (!) 142/68   Pulse 63   Ht 5\' 6"  (1.676 m)   Wt 240 lb (108.9 kg)   SpO2 92%   BMI 38.74 kg/m?    ? ?Wt Readings from Last 3 Encounters:  ?07/13/21 240 lb (108.9 kg)  ?05/25/21 246 lb 14.6 oz (112 kg)  ?12/08/19 277 lb (125.6 kg)  ?  ?Gen: No distress, morbid obesity, smells of smoke ?Neck: No JVD ?Ears: Bilateral Pilar Plate Sign ?Cardiac: No Rubs or Gallops, no Murmur,  IRIR +2 radial pulses ?Respiratory: Clear to auscultation bilaterally, normal effort, normal  respiratory rate ?GI: Soft, nontender, non-distended  ?MS: +1 R leg, +2 L leg edema;  moves all extremities ?Integument: Skin feels warm ?Neuro:  At time of evaluation, alert and oriented to person/place/time/situation  ?Psych: Normal affect, patient feels well ? ? ?ASSESSMENT:   ? ?1. Permanent atrial fibrillation ([place])   ?2. Acute on chronic diastolic CHF (congestive heart failure) ([place])   ?3. Primary hypertension   ?4. Stage 3a chronic kidney disease ([place])   ?5. Chronic obstructive pulmonary disease, unspecified COPD type ([place])   ?6. Obesity, Class III, BMI 40-49.9 (morbid obesity) ([place])   ?7. Tobacco abuse   ? ?PLAN:   ? ?Long standing persistent AF-> Permanent AF ?- CHASDVASC 5 ?HTN  with DM ?HFpEF ?OSA on O2 no CPAP ?Morbid Obesity ?COPD and tobacco abuse ?HLD with DM ?CKD stage IIIB seen by Dr. Theador Black. ?- patient to double up on his lasix for three days then back to 40 mg daily; he  is to back off the salty meats ?- continue eliquis ?- patient is precontemplative for smoking cessation; no smoking while on O2 ?- continue coreg 6.25 mg PO daily ?- Continue norvasc 10 ?- reaching out to our Pharm D Teammates would like to start Jardiance 10 mg ? ?Fall f/u ? ? ?   ? ?Medication Adjustments/Labs and Tests Ordered: ?Current medicines are reviewed at length with the patient today.  Concerns regarding medicines are outlined above.  ?No orders of the defined types were placed in this encounter. ? ?No orders of the defined types were placed in this encounter. ? ? ?Patient Instructions  ?Follow-Up: ?Follow up with Dr. Gasper Sells  in the fall of 2023. ? ?Any Other Special Instructions Will Be Listed Below (If Applicable). ? ? ? ? ?If you need a refill on your cardiac medications before your next appointment, please call your pharmacy. ?  ? ?Signed, ?Werner Lean, MD  ?07/13/2021 4:41 PM    ?[facility] ? ?

## 2021-07-17 ENCOUNTER — Ambulatory Visit
Admission: EM | Admit: 2021-07-17 | Discharge: 2021-07-17 | Disposition: A | Discharge status: Home / Self Care | Payer: Medicare HMO | Attending: Family Medicine | Admitting: Family Medicine

## 2021-07-17 ENCOUNTER — Encounter: Payer: Self-pay | Admitting: Emergency Medicine

## 2021-07-17 ENCOUNTER — Other Ambulatory Visit: Payer: Self-pay

## 2021-07-17 DIAGNOSIS — Z23 Encounter for immunization: Secondary | ICD-10-CM | POA: Diagnosis not present

## 2021-07-17 DIAGNOSIS — S90811A Abrasion, right foot, initial encounter: Secondary | ICD-10-CM | POA: Diagnosis not present

## 2021-07-17 DIAGNOSIS — T148XXA Other injury of unspecified body region, initial encounter: Secondary | ICD-10-CM

## 2021-07-17 MED ORDER — TETANUS-DIPHTH-ACELL PERTUSSIS 5-2.5-18.5 LF-MCG/0.5 IM SUSY
0.5000 mL | PREFILLED_SYRINGE | Freq: Once | INTRAMUSCULAR | Status: AC
  Administered 2021-07-17: 0.5 mL via INTRAMUSCULAR

## 2021-07-17 MED ORDER — CEPHALEXIN 500 MG PO CAPS: 500.0000 mg | ORAL_CAPSULE | Freq: Two times a day (BID) | ORAL | 0 refills | Status: DC

## 2021-07-17 NOTE — ED Provider Notes (Signed)
?[facility] ? ? ? ?CSN: [id number] ?Arrival date & time: 07/17/21  1244 ? ? ?  ? ?History   ?Chief Complaint ?Chief Complaint  ?Patient presents with  ? Toe Injury  ? ? ?HPI ?Craig Black is a 76 y.o. male.  ? ?Presenting today with an abrasion to the right second toe that occurred last night when a mouse bit him.  He states he witnessed the mouse bite him.  He has been cleaning the area with peroxide and denies any drainage, fevers, worsening swelling or redness in the site.  He states he is concerned because he has diabetes and does not want to get infected.  He does not remember when his last tetanus shot was. ? ? ?Past Medical History:  ?Diagnosis Date  ? Arthritis   ? Chronic kidney disease   ? Chronic sinusitis   ? COPD (chronic obstructive pulmonary disease) ([place])   ? Diabetes mellitus   ? Gout   ? Hard of hearing   ? Right ear  ? Hyperchloremia   ? Hypertension   ? Murmur   ? Neuropathy in diabetes [facility])   ? Obesity   ? OSA (obstructive sleep apnea)   ? Paroxysmal A-fib ([place])   ? Tobacco abuse   ? ? ?Patient Active Problem List  ? Diagnosis Date Noted  ? Permanent atrial fibrillation ([place]) 07/13/2021  ? Physical deconditioning 05/24/2021  ? Obesity, Class III, BMI 40-49.9 (morbid obesity) ([place]) 05/23/2021  ? Acute respiratory failure with hypoxia ([place]) 05/21/2021  ? Acute on chronic diastolic CHF (congestive heart failure) ([place]) 05/21/2021  ? CKD (chronic kidney disease) stage 3, GFR 30-59 ml/min (H[facility]) 05/21/2021  ? Diabetes mellitus ([place]) 06/26/2011  ? Acute renal failure ([place]) 06/26/2011  ? Hypertension   ? OSA (obstructive sleep apnea)   ? Paroxysmal A-fib ([place])   ? Neuropathy in diabetes [facility])   ? COPD (chronic obstructive pulmonary disease) ([place])   ? Tobacco abuse   ? Hard of hearing   ? ? ?Past Surgical History:  ?Procedure Laterality Date  ? APPENDECTOMY    ? cataract    ? right lens replacement  ? EXTRACORPOREAL SHOCK WAVE LITHOTRIPSY Left 12/21/2019  ? Procedure: EXTRACORPOREAL SHOCK WAVE  LITHOTRIPSY (ESWL);  Surgeon: Cleon Gustin, MD;  Location: [facility];  Service: Urology;  Laterality: Left;  ? HERNIA REPAIR    ? SINUS EXPLORATION    ? 1998  ? ? ? ? ? ?Home Medications   ? ?Prior to Admission medications   ?Medication Sig Start Date End Date Taking? Authorizing Provider  ?cephALEXin (KEFLEX) 500 MG capsule Take 1 capsule (500 mg total) by mouth 2 (two) times daily. 07/17/21  Yes Volney American, PA-C  ?albuterol (VENTOLIN HFA) 108 (90 Base) MCG/ACT inhaler Inhale 2 puffs into the lungs every 6 (six) hours as needed for wheezing or shortness of breath.    [provider]  ?amLODipine (NORVASC) 10 MG tablet Take 10 mg by mouth daily. 03/17/21   [provider]  ?apixaban (ELIQUIS) 5 MG TABS tablet Take 5 mg by mouth 2 (two) times daily.    [provider]  ?calcitRIOL (ROCALTROL) 0.25 MCG capsule Take 0.25 mcg by mouth 3 (three) times a week. 02/07/21   [provider]  ?carvedilol (COREG) 6.25 MG tablet Take 6.25 mg by mouth 2 (two) times daily. 04/29/21   [provider]  ?furosemide (LASIX) 20 MG tablet Take 40 mg by mouth daily. 03/13/21   [provider]  ?Insulin Aspart (NOVOLOG IJ) Inject 10 Units as directed 3 (three) times daily as needed (sugar). If blood sugar is high he will add the 10 units to the 55 units of novolog 70/30 55 units    [provider]  ?insulin aspart protamine- aspart (NOVOLOG MIX 70/30) (70-30) 100 UNIT/ML injection Inject 0.25 mLs (25 Units total) into the skin 2 (two) times daily with a meal. 05/25/21   Dessa Phi, DO  ?loratadine (CLARITIN) 10 MG tablet Take 10 mg by mouth daily.    [provider]  ?Donnal Debar 100-62.5-25 MCG/ACT AEPB Inhale 1 puff into the lungs daily. 03/11/21   [provider]  ? ? ?Family History ?Family History  ?Adopted: Yes  ? ? ?Social History ?Social History  ? ?Tobacco Use  ? Smoking status: Every Day  ?  Types: Cigars  ? Smokeless tobacco:  Never  ? Tobacco comments:  ?  3 cigars daily   ?Vaping Use  ? Vaping Use: Never used  ?Substance Use Topics  ? Alcohol use: Not Currently  ? Drug use: No  ? ? ? ?Allergies   ?Patient has no known allergies. ? ? ?Review of Systems ?Review of Systems ?Per HPI ? ?Physical Exam ?Triage Vital Signs ?ED Triage Vitals  ?Enc Vitals Group  ?   BP 07/17/21 1539 (!) 154/75  ?   Pulse Rate 07/17/21 1539 87  ?   Resp 07/17/21 1539 18  ?   Temp 07/17/21 1539 97.9 ?F (36.6 ?C)  ?   Temp Source 07/17/21 1539 Oral  ?   SpO2 07/17/21 1539 91 %  ?   Weight 07/17/21 1540 238 lb 1.6 oz (108 kg)  ?   Height 07/17/21 1540 5\' 6"  (1.676 m)  ?   Head Circumference --   ?   Peak Flow --   ?   Pain Score 07/17/21 1540 0  ?   Pain Loc --   ?   Pain Edu? --   ?   Excl. in [place]? --   ? ?No data found. ? ?Updated Vital Signs ?BP (!) 154/75 (BP Location: Right Arm)   Pulse 87   Temp 97.9 ?F (36.6 ?C) (Oral)   Resp 18   Ht 5\' 6"  (1.676 m)   Wt 238 lb 1.6 oz (108 kg)   SpO2 91% Comment: pt reports wears 2 liters [place] at home. pt did not bring oxygen to UC.  BMI 38.43 kg/m?  ? ?Visual Acuity ?Right Eye Distance:   ?Left Eye Distance:   ?Bilateral Distance:   ? ?Right Eye Near:   ?Left Eye Near:    ?Bilateral Near:    ? ?Physical Exam ?Vitals and nursing note reviewed.  ?Constitutional:   ?   Appearance: Normal appearance.  ?HENT:  ?   Head: Atraumatic.  ?Eyes:  ?   Extraocular Movements: Extraocular movements intact.  ?   Conjunctiva/sclera: Conjunctivae normal.  ?Cardiovascular:  ?   Rate and Rhythm: Normal rate and regular rhythm.  ?Pulmonary:  ?   Effort: Pulmonary effort is normal.  ?   Breath sounds: Normal breath sounds.  ?Musculoskeletal:     ?   General: Signs of injury present. Normal range of motion.  ?   Cervical back: Normal range of motion and neck supple.  ?   Comments: Diffuse 1+ edema right foot and leg, chronic per patient  ?Skin: ?   General: Skin is warm and dry.  ?   Comments:  Superficial abrasion less than 0.25 cm to dorsal  aspect of right second toe, no active bleeding or drainage, no worsened surrounding erythema, edema to the site.  ?Neurological:  ?   Mental Status: He is oriented to person, place, and time. Mental status is at baseline.  ?Psychiatric:     ?   Mood and Affect: Mood normal.     ?   Thought Content: Thought content normal.     ?   Judgment: Judgment normal.  ? ? ? ?UC Treatments / Results  ?Labs ?(all labs ordered are listed, but only abnormal results are displayed) ?Labs Reviewed - No data to display ? ?EKG ? ? ?Radiology ?No results found. ? ?Procedures ?Procedures (including critical care time) ? ?Medications Ordered in UC ?Medications  ?Tdap (BOOSTRIX) injection 0.5 mL (0.5 mLs Intramuscular Given 07/17/21 1553)  ? ? ?Initial Impression / Assessment and Plan / UC Course  ?I have reviewed the triage vital signs and the nursing notes. ? ?Pertinent labs & imaging results that were available during my care of the patient were reviewed by me and considered in my medical decision making (see chart for details). ? ?  ? ?Update tetanus shot as he is unsure when his last one was.  We will also start Keflex given his history of diabetes and chronic leg edema which put him at risk for foot infection.  Discussed home wound care, strict return precautions.  Reviewed with him that we are unable to start a rabies prophylaxis series in the setting and he would need to the emergency department for this, he declines doing this at this time. ? ?Final Clinical Impressions(s) / UC Diagnoses  ? ?Final diagnoses:  ?Animal bite  ?Abrasion of right foot, initial encounter  ? ? ? ?Discharge Instructions   ? ?  ?Keep the area clean, dressed with Neosporin ointment and covered with bandaging.  Return immediately if the area becomes worsening in any way, particularly redness, swelling, thick drainage ? ? ? ?ED Prescriptions   ? ? Medication Sig Dispense Auth. Provider  ? cephALEXin (KEFLEX) 500 MG capsule Take 1 capsule (500 mg total) by mouth  2 (two) times daily. 14 capsule Volney American, [place]  ? ?  ? ?PDMP not reviewed this encounter. ?  ?Volney American, PA-C ?07/17/21 1559 ? ?

## 2021-07-17 NOTE — Discharge Instructions (Addendum)
Keep the area clean, dressed with Neosporin ointment and covered with bandaging.  Return immediately if the area becomes worsening in any way, particularly redness, swelling, thick drainage ?

## 2021-07-17 NOTE — ED Triage Notes (Signed)
Pt reports was bitten by a mouse last night on 2nd digit of right toe. Pt denies pain and reports " I was just worried with my diabetes and wanted to get it looked at." ?

## 2021-07-26 ENCOUNTER — Telehealth: Payer: Self-pay | Admitting: *Deleted

## 2021-07-26 NOTE — Telephone Encounter (Signed)
Called pt. No answer. Left msg to call back.  

## 2021-07-26 NOTE — Telephone Encounter (Signed)
-----   Message from Alvin Critchley sent at 07/23/2021  4:08 PM EDT ----- ?Regarding: FW: Jardiance, labs in two weeks and PCP notification ? ?----- Message ----- ?From: Werner Lean, MD ?Sent: 07/22/2021   2:50 PM EDT ?To: [facility] Cma ?Subject: Jardiance, labs in two weeks and PCP notific# ? ?Hi team, ? ?Can we offer Mr. Craig Black 10?  Its ~ $45 dollars, he still have LE edema.  We would also need to notified his PCP and this could alter his BG mgmt.  Lastly he would need BMP in two weeks. ? ?Thanks! ? ?MAC ? ? ?

## 2021-07-29 DIAGNOSIS — I10 Essential (primary) hypertension: Secondary | ICD-10-CM | POA: Diagnosis not present

## 2021-07-29 DIAGNOSIS — E1165 Type 2 diabetes mellitus with hyperglycemia: Secondary | ICD-10-CM | POA: Diagnosis not present

## 2021-07-30 MED ORDER — EMPAGLIFLOZIN 10 MG PO TABS: 10.0000 mg | ORAL_TABLET | Freq: Every day | ORAL | 11 refills | Status: DC

## 2021-07-30 NOTE — Telephone Encounter (Signed)
?*  STAT* If patient is at the pharmacy, call can be transferred to refill team. ? ? ?1. Which medications need to be refilled? (please list name of each medication and dose if known)  ?Jardiance  ? ?2. Which pharmacy/location (including street and city if local pharmacy) is medication to be sent to? ?[id number] [place], [place] [address] ? ?3. Do they need a 30 day or 90 day supply?  ?90 day supply ? ? ?Charleston Ropes is following up on behalf of the patient. She states she contacted patient's local pharmacy, but they do not have an Rx for Jardiance.  ?

## 2021-07-30 NOTE — Telephone Encounter (Signed)
Jardiance called into [facility] [place].  ?

## 2021-08-10 ENCOUNTER — Other Ambulatory Visit (HOSPITAL_COMMUNITY)
Admission: RE | Admit: 2021-08-10 | Discharge: 2021-08-10 | Disposition: A | Discharge status: Home / Self Care | Payer: Medicare HMO | Source: Ambulatory Visit | Attending: Internal Medicine | Admitting: Internal Medicine

## 2021-08-10 ENCOUNTER — Other Ambulatory Visit: Payer: Self-pay

## 2021-08-10 DIAGNOSIS — Z79899 Other long term (current) drug therapy: Secondary | ICD-10-CM

## 2021-08-10 LAB — BASIC METABOLIC PANEL
Anion gap: 9 (ref 5–15)
BUN: 26 mg/dL — ABNORMAL HIGH (ref 8–23)
CO2: 26 mmol/L (ref 22–32)
Calcium: 9.5 mg/dL (ref 8.9–10.3)
Chloride: 104 mmol/L (ref 98–111)
Creatinine, Ser: 1.85 mg/dL — ABNORMAL HIGH (ref 0.61–1.24)
GFR, Estimated: 37 mL/min — ABNORMAL LOW (ref >60)
Glucose, Bld: 120 mg/dL — ABNORMAL HIGH (ref 70–99)
Potassium: 4.3 mmol/L (ref 3.5–5.1)
Sodium: 139 mmol/L (ref 135–145)

## 2021-08-14 ENCOUNTER — Ambulatory Visit: Payer: Medicare HMO | Admitting: Urology

## 2021-08-15 ENCOUNTER — Encounter: Payer: Self-pay | Admitting: Urology

## 2021-08-15 ENCOUNTER — Ambulatory Visit (INDEPENDENT_AMBULATORY_CARE_PROVIDER_SITE_OTHER): Payer: Medicare HMO | Admitting: Urology

## 2021-08-15 VITALS — BP 150/62 | HR 76

## 2021-08-15 DIAGNOSIS — R350 Frequency of micturition: Secondary | ICD-10-CM

## 2021-08-15 DIAGNOSIS — R3912 Poor urinary stream: Secondary | ICD-10-CM | POA: Diagnosis not present

## 2021-08-15 DIAGNOSIS — N401 Enlarged prostate with lower urinary tract symptoms: Secondary | ICD-10-CM

## 2021-08-15 DIAGNOSIS — N400 Enlarged prostate without lower urinary tract symptoms: Secondary | ICD-10-CM

## 2021-08-15 LAB — MICROSCOPIC EXAMINATION
Epithelial Cells (non renal): NONE SEEN /hpf (ref 0–10)
Renal Epithel, UA: NONE SEEN /hpf
WBC, UA: NONE SEEN /hpf (ref 0–5)

## 2021-08-15 LAB — URINALYSIS, ROUTINE W REFLEX MICROSCOPIC
Bilirubin, UA: NEGATIVE
Ketones, UA: NEGATIVE
Leukocytes,UA: NEGATIVE
Nitrite, UA: NEGATIVE
Specific Gravity, UA: 1.02 (ref 1.005–1.030)
Urobilinogen, Ur: 0.2 mg/dL (ref 0.2–1.0)
pH, UA: 5.5 (ref 5.0–7.5)

## 2021-08-15 MED ORDER — ALFUZOSIN HCL ER 10 MG PO TB24: 10.0000 mg | ORAL_TABLET | Freq: Every evening | ORAL | 11 refills | Status: DC

## 2021-08-15 NOTE — Patient Instructions (Signed)

## 2021-08-15 NOTE — Progress Notes (Signed)
post void residual=83 

## 2021-08-15 NOTE — Progress Notes (Signed)
? ?08/15/2021 ?2:10 PM  ? ?Craig Black ?04/11/1946 ?[id number] ? ?Referring provider: Liana Gerold, MD ?[address] ?[place],  [place] [id number] ? ?Urinary frequency and weak urinary stream ? ? ?HPI: ?Craig Black is 76yo here for evaluation of urinary frequency and a weak urinary stream. For the past 4-5 months he has noted progressive LUTS. IPSS 23 QOL 6. He has been on lasix for over 1 year for lower extremity edema. He has CKD3. Nocturia 4-5x, urinary frequency every 1 hour. His urine stream is weak. He has numerous urge incontinent episodes per day. He uses 3-4 pads per day which are soaked. He started jardiance 3 weeks ago. PVR 83cc.   ? ? ?PMH: ?Past Medical History:  ?Diagnosis Date  ? Arthritis   ? Chronic kidney disease   ? Chronic sinusitis   ? COPD (chronic obstructive pulmonary disease) ([place])   ? Diabetes mellitus   ? Gout   ? Hard of hearing   ? Right ear  ? Hyperchloremia   ? Hypertension   ? Murmur   ? Neuropathy in diabetes [facility])   ? Obesity   ? OSA (obstructive sleep apnea)   ? Paroxysmal A-fib ([place])   ? Tobacco abuse   ? ? ?Surgical History: ?Past Surgical History:  ?Procedure Laterality Date  ? APPENDECTOMY    ? cataract    ? right lens replacement  ? EXTRACORPOREAL SHOCK WAVE LITHOTRIPSY Left 12/21/2019  ? Procedure: EXTRACORPOREAL SHOCK WAVE LITHOTRIPSY (ESWL);  Surgeon: Cleon Gustin, MD;  Location: [facility];  Service: Urology;  Laterality: Left;  ? HERNIA REPAIR    ? SINUS EXPLORATION    ? 1998  ? ? ?Home Medications:  ?Allergies as of 08/15/2021   ?No Known Allergies ?  ? ?  ?Medication List  ?  ? ?  ? Accurate as of Aug 15, 2021  2:10 PM. If you have any questions, ask your nurse or doctor.  ?  ?  ? ?  ? ?albuterol 108 (90 Base) MCG/ACT inhaler ?Commonly known as: VENTOLIN HFA ?Inhale 2 puffs into the lungs every 6 (six) hours as needed for wheezing or shortness of breath. ?  ?amLODipine 10 MG tablet ?Commonly known as: NORVASC ?Take 10 mg by mouth daily. ?  ?apixaban 5 MG Tabs  tablet ?Commonly known as: ELIQUIS ?Take 5 mg by mouth 2 (two) times daily. ?  ?calcitRIOL 0.25 MCG capsule ?Commonly known as: ROCALTROL ?Take 0.25 mcg by mouth 3 (three) times a week. ?  ?carvedilol 6.25 MG tablet ?Commonly known as: COREG ?Take 6.25 mg by mouth 2 (two) times daily. ?  ?cephALEXin 500 MG capsule ?Commonly known as: KEFLEX ?Take 1 capsule (500 mg total) by mouth 2 (two) times daily. ?  ?empagliflozin 10 MG Tabs tablet ?Commonly known as: Jardiance ?Take 1 tablet (10 mg total) by mouth daily. ?  ?finasteride 5 MG tablet ?Commonly known as: PROSCAR ?Take 5 mg by mouth daily. ?  ?furosemide 20 MG tablet ?Commonly known as: LASIX ?Take 40 mg by mouth daily. ?  ?insulin aspart protamine- aspart (70-30) 100 UNIT/ML injection ?Commonly known as: NOVOLOG MIX 70/30 ?Inject 0.25 mLs (25 Units total) into the skin 2 (two) times daily with a meal. ?  ?loratadine 10 MG tablet ?Commonly known as: CLARITIN ?Take 10 mg by mouth daily. ?  ?NOVOLOG IJ ?Inject 10 Units as directed 3 (three) times daily as needed (sugar). If blood sugar is high he will add the 10 units to the  55 units of novolog 70/30 55 units ?  ?Trelegy Ellipta 100-62.5-25 MCG/ACT Aepb ?Generic drug: Fluticasone-Umeclidin-Vilant ?Inhale 1 puff into the lungs daily. ?  ? ?  ? ? ?Allergies: No Known Allergies ? ?Family History: ?Family History  ?Adopted: Yes  ? ? ?Social History:  reports that he has been smoking cigars. He has never used smokeless tobacco. He reports that he does not currently use alcohol. He reports that he does not use drugs. ? ?ROS: ?All other review of systems were reviewed and are negative except what is noted above in HPI ? ?Physical Exam: ?BP (!) 150/62   Pulse 76   ?Constitutional:  Alert and oriented, No acute distress. ?HEENT: [place] AT, moist mucus membranes.  Trachea midline, no masses. ?Cardiovascular: No clubbing, cyanosis, or edema. ?Respiratory: Normal respiratory effort, no increased work of breathing. ?GI: Abdomen is  soft, nontender, nondistended, no abdominal masses ?GU: No CVA tenderness. Circumcised phallus. No masses/lesions on penis, testis, scrotum. Prostate 40g smooth no nodules no induration.  ?Lymph: No cervical or inguinal lymphadenopathy. ?Skin: No rashes, bruises or suspicious lesions. ?Neurologic: Grossly intact, no focal deficits, moving all 4 extremities. ?Psychiatric: Normal mood and affect. ? ?Laboratory Data: ?Lab Results  ?Component Value Date  ? WBC 8.0 05/21/2021  ? HGB 15.3 05/21/2021  ? HCT 48.5 05/21/2021  ? MCV 99.0 05/21/2021  ? PLT 247 05/21/2021  ? ? ?Lab Results  ?Component Value Date  ? CREATININE 1.85 (H) 08/10/2021  ? ? ?No results found for: PSA ? ?No results found for: TESTOSTERONE ? ?Lab Results  ?Component Value Date  ? HGBA1C 6.9 (H) 03/12/2021  ? ? ?Urinalysis ?   ?Component Value Date/Time  ? COLORURINE YELLOW 03/17/2021 1654  ? APPEARANCEUR CLEAR 03/17/2021 1654  ? LABSPEC 1.015 03/17/2021 1654  ? PHURINE 5.5 03/17/2021 1654  ? GLUCOSEU NEGATIVE 03/17/2021 1654  ? HGBUR TRACE (A) 03/17/2021 1654  ? [place] NEGATIVE 03/17/2021 1654  ? Benjamin Stain NEGATIVE 03/17/2021 1654  ? PROTEINUR NEGATIVE 03/17/2021 1654  ? NITRITE NEGATIVE 03/17/2021 1654  ? LEUKOCYTESUR NEGATIVE 03/17/2021 1654  ? ? ?Lab Results  ?Component Value Date  ? BACTERIA RARE (A) 03/17/2021  ? ? ?Pertinent Imaging: ? ?Results for orders placed during the hospital encounter of 12/21/19 ? ?DG Abd 1 View ? ?Narrative ?CLINICAL DATA:  Preop evaluation for upcoming lithotripsy ? ?EXAM: ?ABDOMEN - 1 VIEW ? ?COMPARISON:  12/08/2019 ? ?FINDINGS: ?Scattered large and small bowel gas is noted. Known left renal stone ?is again identified and stable. Vascular calcifications are noted in ?the kidneys bilaterally. No free air is noted. Degenerative changes ?of lumbar spine are seen. ? ?IMPRESSION: ?Stable small left renal stone. ? ? ?Electronically Signed ?By: Inez Catalina M.D. ?On: 12/21/2019 08:31 ? ?No results found for this or any  previous visit. ? ?No results found for this or any previous visit. ? ?No results found for this or any previous visit. ? ?Results for orders placed during the hospital encounter of 04/19/21 ? ?US RENAL ? ?Narrative ?CLINICAL DATA:  Acute renal failure with tubular necrosis. Status ?post left lithotripsy 12/2019. ? ?EXAM: ?RENAL / URINARY TRACT ULTRASOUND COMPLETE ? ?COMPARISON:  Renal ultrasound 11/29/2019 ? ?FINDINGS: ?Right Kidney: ? ?Renal measurements: 13.3 x 6.6 x 6.4 = volume: 296 mL. Echogenicity ?within normal limits. No mass or hydronephrosis visualized. ? ?Left Kidney: ? ?Renal measurements: 13.8 x 4.9 x 6.0 = volume: 208 mL. Echogenicity ?within normal limits. No mass or hydronephrosis visualized. ? ?Bladder: ? ?Possible minimal layering echogenic  debris within the prevoid ?urinary bladder. Prevoid urinary bladder volume: 225 mL. Postvoid ?residual: 41 mL. ? ?Other: ? ?None. ? ?IMPRESSION: ?1. Normal appearance of the bilateral kidneys. ?2. Mildly increased postvoid urinary bladder volume residual. ?Possible small amount of debris within the prevoid urinary bladder. ? ? ?Electronically Signed ?By: Yvonne Kendall ?On: 04/19/2021 17:19 ? ?No results found for this or any previous visit. ? ?No results found for this or any previous visit. ? ?No results found for this or any previous visit. ? ? ?Assessment & Plan:   ? ?1. Benign prostatic hyperplasia, unspecified whether lower urinary tract symptoms present ?-we will trial uroxatral 10mg  qhs ?- Urinalysis, Routine w reflex microscopic ?- BLADDER SCAN AMB NON-IMAGING ? ?2. Urinary frequency ?-uroxatral 10mg  qhs ? ?No follow-ups on file. ? ?Nicolette Bang, MD ? ?[facility] ?  ?

## 2021-08-17 DIAGNOSIS — R809 Proteinuria, unspecified: Secondary | ICD-10-CM | POA: Diagnosis not present

## 2021-08-17 DIAGNOSIS — I129 Hypertensive chronic kidney disease with stage 1 through stage 4 chronic kidney disease, or unspecified chronic kidney disease: Secondary | ICD-10-CM | POA: Diagnosis not present

## 2021-08-17 DIAGNOSIS — E1122 Type 2 diabetes mellitus with diabetic chronic kidney disease: Secondary | ICD-10-CM | POA: Diagnosis not present

## 2021-08-17 DIAGNOSIS — N400 Enlarged prostate without lower urinary tract symptoms: Secondary | ICD-10-CM | POA: Diagnosis not present

## 2021-08-17 DIAGNOSIS — E1129 Type 2 diabetes mellitus with other diabetic kidney complication: Secondary | ICD-10-CM | POA: Diagnosis not present

## 2021-08-17 DIAGNOSIS — E211 Secondary hyperparathyroidism, not elsewhere classified: Secondary | ICD-10-CM | POA: Diagnosis not present

## 2021-08-17 DIAGNOSIS — N189 Chronic kidney disease, unspecified: Secondary | ICD-10-CM | POA: Diagnosis not present

## 2021-08-22 ENCOUNTER — Encounter (HOSPITAL_COMMUNITY): Payer: Self-pay

## 2021-08-22 ENCOUNTER — Emergency Department (HOSPITAL_COMMUNITY)
Admission: EM | Admit: 2021-08-22 | Discharge: 2021-08-22 | Disposition: A | Discharge status: Home / Self Care | Payer: Medicare HMO | Attending: Emergency Medicine | Admitting: Emergency Medicine

## 2021-08-22 ENCOUNTER — Other Ambulatory Visit: Payer: Self-pay

## 2021-08-22 ENCOUNTER — Emergency Department (HOSPITAL_COMMUNITY): Payer: Medicare HMO

## 2021-08-22 DIAGNOSIS — J449 Chronic obstructive pulmonary disease, unspecified: Secondary | ICD-10-CM | POA: Diagnosis not present

## 2021-08-22 DIAGNOSIS — N200 Calculus of kidney: Secondary | ICD-10-CM | POA: Diagnosis not present

## 2021-08-22 DIAGNOSIS — N1832 Chronic kidney disease, stage 3b: Secondary | ICD-10-CM | POA: Diagnosis not present

## 2021-08-22 DIAGNOSIS — I480 Paroxysmal atrial fibrillation: Secondary | ICD-10-CM | POA: Insufficient documentation

## 2021-08-22 DIAGNOSIS — Z79899 Other long term (current) drug therapy: Secondary | ICD-10-CM | POA: Diagnosis not present

## 2021-08-22 DIAGNOSIS — E119 Type 2 diabetes mellitus without complications: Secondary | ICD-10-CM | POA: Insufficient documentation

## 2021-08-22 DIAGNOSIS — Z794 Long term (current) use of insulin: Secondary | ICD-10-CM | POA: Insufficient documentation

## 2021-08-22 DIAGNOSIS — K5900 Constipation, unspecified: Secondary | ICD-10-CM | POA: Insufficient documentation

## 2021-08-22 DIAGNOSIS — E211 Secondary hyperparathyroidism, not elsewhere classified: Secondary | ICD-10-CM | POA: Diagnosis not present

## 2021-08-22 DIAGNOSIS — N189 Chronic kidney disease, unspecified: Secondary | ICD-10-CM | POA: Diagnosis not present

## 2021-08-22 DIAGNOSIS — I129 Hypertensive chronic kidney disease with stage 1 through stage 4 chronic kidney disease, or unspecified chronic kidney disease: Secondary | ICD-10-CM | POA: Diagnosis not present

## 2021-08-22 DIAGNOSIS — K921 Melena: Secondary | ICD-10-CM | POA: Insufficient documentation

## 2021-08-22 DIAGNOSIS — Z7951 Long term (current) use of inhaled steroids: Secondary | ICD-10-CM | POA: Insufficient documentation

## 2021-08-22 DIAGNOSIS — Z7901 Long term (current) use of anticoagulants: Secondary | ICD-10-CM | POA: Diagnosis not present

## 2021-08-22 DIAGNOSIS — E1129 Type 2 diabetes mellitus with other diabetic kidney complication: Secondary | ICD-10-CM | POA: Diagnosis not present

## 2021-08-22 DIAGNOSIS — I10 Essential (primary) hypertension: Secondary | ICD-10-CM | POA: Diagnosis not present

## 2021-08-22 DIAGNOSIS — N2581 Secondary hyperparathyroidism of renal origin: Secondary | ICD-10-CM | POA: Diagnosis not present

## 2021-08-22 DIAGNOSIS — E1122 Type 2 diabetes mellitus with diabetic chronic kidney disease: Secondary | ICD-10-CM | POA: Diagnosis not present

## 2021-08-22 DIAGNOSIS — I700 Atherosclerosis of aorta: Secondary | ICD-10-CM | POA: Diagnosis not present

## 2021-08-22 DIAGNOSIS — R809 Proteinuria, unspecified: Secondary | ICD-10-CM | POA: Diagnosis not present

## 2021-08-22 LAB — LIPASE, BLOOD: Lipase: 21 U/L (ref 11–51)

## 2021-08-22 LAB — COMPREHENSIVE METABOLIC PANEL
ALT: 16 U/L (ref 0–44)
AST: 18 U/L (ref 15–41)
Albumin: 3.8 g/dL (ref 3.5–5.0)
Alkaline Phosphatase: 68 U/L (ref 38–126)
Anion gap: 9 (ref 5–15)
BUN: 29 mg/dL — ABNORMAL HIGH (ref 8–23)
CO2: 27 mmol/L (ref 22–32)
Calcium: 9.5 mg/dL (ref 8.9–10.3)
Chloride: 105 mmol/L (ref 98–111)
Creatinine, Ser: 1.98 mg/dL — ABNORMAL HIGH (ref 0.61–1.24)
GFR, Estimated: 34 mL/min — ABNORMAL LOW (ref >60)
Glucose, Bld: 54 mg/dL — ABNORMAL LOW (ref 70–99)
Potassium: 4.3 mmol/L (ref 3.5–5.1)
Sodium: 141 mmol/L (ref 135–145)
Total Bilirubin: 0.6 mg/dL (ref 0.3–1.2)
Total Protein: 7.1 g/dL (ref 6.5–8.1)

## 2021-08-22 LAB — CBC WITH DIFFERENTIAL/PLATELET
Abs Immature Granulocytes: 0.01 10*3/uL (ref 0.00–0.07)
Basophils Absolute: 0.1 10*3/uL (ref 0.0–0.1)
Basophils Relative: 1 %
Eosinophils Absolute: 0.2 10*3/uL (ref 0.0–0.5)
Eosinophils Relative: 3 %
HCT: 45.9 % (ref 39.0–52.0)
Hemoglobin: 15.1 g/dL (ref 13.0–17.0)
Immature Granulocytes: 0 %
Lymphocytes Relative: 19 %
Lymphs Abs: 1.5 10*3/uL (ref 0.7–4.0)
MCH: 30.7 pg (ref 26.0–34.0)
MCHC: 32.9 g/dL (ref 30.0–36.0)
MCV: 93.3 fL (ref 80.0–100.0)
Monocytes Absolute: 0.8 10*3/uL (ref 0.1–1.0)
Monocytes Relative: 11 %
Neutro Abs: 5.1 10*3/uL (ref 1.7–7.7)
Neutrophils Relative %: 66 %
Platelets: 238 10*3/uL (ref 150–400)
RBC: 4.92 MIL/uL (ref 4.22–5.81)
RDW: 16.1 % — ABNORMAL HIGH (ref 11.5–15.5)
WBC: 7.8 10*3/uL (ref 4.0–10.5)
nRBC: 0 % (ref 0.0–0.2)

## 2021-08-22 LAB — URINALYSIS, ROUTINE W REFLEX MICROSCOPIC
Bilirubin Urine: NEGATIVE
Glucose, UA: >=500 mg/dL — AB
Hgb urine dipstick: NEGATIVE
Ketones, ur: NEGATIVE mg/dL
Leukocytes,Ua: NEGATIVE
Nitrite: NEGATIVE
Protein, ur: 100 mg/dL — AB
Specific Gravity, Urine: 1.015 (ref 1.005–1.030)
pH: 5 (ref 5.0–8.0)

## 2021-08-22 LAB — POC OCCULT BLOOD, ED: Fecal Occult Bld: NEGATIVE

## 2021-08-22 LAB — TYPE AND SCREEN
ABO/RH(D): A POS
Antibody Screen: NEGATIVE

## 2021-08-22 LAB — CBG MONITORING, ED
Glucose-Capillary: 68 mg/dL — ABNORMAL LOW (ref 70–99)
Glucose-Capillary: 119 mg/dL — ABNORMAL HIGH (ref 70–99)

## 2021-08-22 LAB — LACTIC ACID, PLASMA: Lactic Acid, Venous: 1.7 mmol/L (ref 0.5–1.9)

## 2021-08-22 MED ORDER — IOHEXOL 300 MG/ML  SOLN
80.0000 mL | Freq: Once | INTRAMUSCULAR | Status: AC | PRN
  Administered 2021-08-22: 80 mL via INTRAVENOUS

## 2021-08-22 NOTE — ED Provider Notes (Signed)
[facility] Provider Note   CSN: [id number] Arrival date & time: 08/22/21  1335     History  Chief Complaint  Patient presents with   Melena    Craig Black is a 76 y.o. male.  HPI  With medical history including diabetes, gout, proximal A-fib currently on Eliquis, COPD presents with complaints of tarry stool.  Patient states that he had an episode of tarry stool about 4 days ago, states that since then he has been constipated, he denies any stomach pains nausea vomiting denies any urinary symptoms denies feeling lightheaded or dizziness.  He has no abdominal history no history of stomach bleeds, stomach ulcers, denies alcohol use or NSAID use.  He is having no other complaints, he states that he was at his kidney doctor today and he told him about this and he was sent here for further evaluation.  Wife at bedside able to validate the story.  Home Medications Prior to Admission medications   Medication Sig Start Date End Date Taking? Authorizing Provider  albuterol (VENTOLIN HFA) 108 (90 Base) MCG/ACT inhaler Inhale 2 puffs into the lungs every 6 (six) hours as needed for wheezing or shortness of breath.    [provider]  alfuzosin (UROXATRAL) 10 MG 24 hr tablet Take 1 tablet (10 mg total) by mouth at bedtime. 08/15/21   McKenzie, Candee Furbish, MD  amLODipine (NORVASC) 10 MG tablet Take 10 mg by mouth daily. 03/17/21   [provider]  apixaban (ELIQUIS) 5 MG TABS tablet Take 5 mg by mouth 2 (two) times daily.    [provider]  calcitRIOL (ROCALTROL) 0.25 MCG capsule Take 0.25 mcg by mouth 3 (three) times a week. 02/07/21   [provider]  carvedilol (COREG) 6.25 MG tablet Take 6.25 mg by mouth 2 (two) times daily. 04/29/21   [provider]  cephALEXin (KEFLEX) 500 MG capsule Take 1 capsule (500 mg total) by mouth 2 (two) times daily. 07/17/21   Volney American, PA-C  empagliflozin (JARDIANCE) 10 MG TABS tablet  Take 1 tablet (10 mg total) by mouth daily. 07/30/21   Chandrasekhar, Terisa Starr, MD  finasteride (PROSCAR) 5 MG tablet Take 5 mg by mouth daily. 06/13/21   [provider]  furosemide (LASIX) 20 MG tablet Take 40 mg by mouth daily. 03/13/21   [provider]  Insulin Aspart (NOVOLOG IJ) Inject 10 Units as directed 3 (three) times daily as needed (sugar). If blood sugar is high he will add the 10 units to the 55 units of novolog 70/30 55 units    [provider]  insulin aspart protamine- aspart (NOVOLOG MIX 70/30) (70-30) 100 UNIT/ML injection Inject 0.25 mLs (25 Units total) into the skin 2 (two) times daily with a meal. 05/25/21   Dessa Phi, DO  loratadine (CLARITIN) 10 MG tablet Take 10 mg by mouth daily.    [provider]  TRELEGY ELLIPTA 100-62.5-25 MCG/ACT AEPB Inhale 1 puff into the lungs daily. 03/11/21   [provider]      Allergies    Patient has no known allergies.    Review of Systems   Review of Systems  Constitutional:  Negative for chills and fever.  Respiratory:  Negative for shortness of breath.   Cardiovascular:  Negative for chest pain.  Gastrointestinal:  Positive for constipation. Negative for abdominal pain, diarrhea and vomiting.  Neurological:  Negative for headaches.   Physical Exam Updated Vital Signs BP (!) 141/70 (BP Location:  Right Arm)   Pulse (!) 109   Temp 98.2 F (36.8 C) (Oral)   Resp 18   Ht 5\' 6"  (1.676 m)   Wt 113.4 kg   SpO2 93%   BMI 40.35 kg/m  Physical Exam Vitals and nursing note reviewed. Exam conducted with a chaperone present.  Constitutional:      General: He is not in acute distress.    Appearance: He is not ill-appearing.  HENT:     Head: Normocephalic and atraumatic.     Nose: No congestion.  Eyes:     Conjunctiva/sclera: Conjunctivae normal.  Cardiovascular:     Rate and Rhythm: Normal rate and regular rhythm.     Pulses: Normal pulses.     Heart sounds: No murmur heard.    No friction rub. No gallop.  Pulmonary:     Effort: No respiratory distress.     Breath sounds: No wheezing, rhonchi or rales.  Abdominal:     Palpations: Abdomen is soft.     Tenderness: There is no abdominal tenderness. There is no right CVA tenderness or left CVA tenderness.  Genitourinary:    Comments: With chaperone present rectal exam was performed he has external nonthrombosed hemorrhoid on the 1 o'clock position, there is no dried amount of blood around the anus, digital rectal exam was performed no large stool burden was present, stool was brown in color, no frank hematochezia or melena Hemoccult was negative. Skin:    General: Skin is warm and dry.  Neurological:     Mental Status: He is alert.  Psychiatric:        Mood and Affect: Mood normal.    ED Results / Procedures / Treatments   Labs (all labs ordered are listed, but only abnormal results are displayed) Labs Reviewed  COMPREHENSIVE METABOLIC PANEL - Abnormal; Notable for the following components:      Result Value   Glucose, Bld 54 (*)    BUN 29 (*)    Creatinine, Ser 1.98 (*)    GFR, Estimated 34 (*)    All other components within normal limits  CBC WITH DIFFERENTIAL/PLATELET - Abnormal; Notable for the following components:   RDW 16.1 (*)    All other components within normal limits  URINALYSIS, ROUTINE W REFLEX MICROSCOPIC - Abnormal; Notable for the following components:   Glucose, UA >=500 (*)    Protein, ur 100 (*)    Bacteria, UA RARE (*)    All other components within normal limits  CBG MONITORING, ED - Abnormal; Notable for the following components:   Glucose-Capillary 68 (*)    All other components within normal limits  CBG MONITORING, ED - Abnormal; Notable for the following components:   Glucose-Capillary 119 (*)    All other components within normal limits  LIPASE, BLOOD  LACTIC ACID, PLASMA  POC OCCULT BLOOD, ED  TYPE AND SCREEN    EKG EKG Interpretation  Date/Time:  Wednesday Aug 22 2021 16:08:44 EDT Ventricular Rate:  79 PR Interval:    QRS Duration: 158 QT Interval:  420 QTC Calculation: 481 R Axis:   -65 Text Interpretation: Atrial fibrillation Left axis deviation Right bundle branch block Abnormal ECG When compared with ECG of 21-May-2021 16:34, PREVIOUS ECG IS PRESENT Confirmed by Thamas Jaegers ([id number]) on 08/22/2021 5:55:12 PM  Radiology CT Abdomen Pelvis W Contrast  Result Date: 08/22/2021 CLINICAL DATA:  Bowel obstruction suspected. Black tori stools over the last 4-5 days. EXAM: CT ABDOMEN AND PELVIS WITH CONTRAST TECHNIQUE:  Multidetector CT imaging of the abdomen and pelvis was performed using the standard protocol following bolus administration of intravenous contrast. RADIATION DOSE REDUCTION: This exam was performed according to the departmental dose-optimization program which includes automated exposure control, adjustment of the mA and/or kV according to patient size and/or use of iterative reconstruction technique. CONTRAST:  77mL OMNIPAQUE IOHEXOL 300 MG/ML  SOLN COMPARISON:  Ultrasound 04/19/2021 FINDINGS: Lower chest: Mild scarring or atelectasis at the lung bases. No pleural fluid. Tiny amount of pericardial fluid. Coronary artery calcification and aortic atherosclerotic calcification. Hepatobiliary: Liver parenchyma is normal. No calcified gallstones. No biliary obstruction. Pancreas: Normal Spleen: Normal Adrenals/Urinary Tract: Adrenal glands are normal. Kidneys are slightly small but there is no evidence of mass, stone or hydronephrosis. Vascular calcification is present. Bladder is normal. Stomach/Bowel: Stomach and small intestine are normal. Normal amount of gas and stool in the colon. No evidence of colon mass, diverticulosis/diverticulitis or obstruction. Vascular/Lymphatic: Aortic atherosclerosis. No aneurysm. IVC is normal. No adenopathy. Reproductive: Normal Other: No free fluid or air. Musculoskeletal: Mild degenerative changes affect the lumbar spine.  IMPRESSION: No acute finding. No evidence of bowel obstruction, mass or inflammation. Aortic atherosclerosis. Slightly small kidneys with extensive vascular calcification. No obstruction. Small amount of pericardial fluid. Coronary artery calcification is noted. Electronically Signed   By: Nelson Chimes M.D.   On: 08/22/2021 17:08    Procedures Procedures    Medications Ordered in ED Medications  iohexol (OMNIPAQUE) 300 MG/ML solution 80 mL (80 mLs Intravenous Contrast Given 08/22/21 1650)    ED Course/ Medical Decision Making/ A&P                           Medical Decision Making  This patient presents to the ED for concern of constipation, this involves an extensive number of treatment options, and is a complaint that carries with it a high risk of complications and morbidity.  The differential diagnosis includes bowel obstruction, GI bleed, diverticulitis, volvulus    Additional history obtained:  Additional history obtained from wife at bedside External records from outside source obtained and reviewed including previous imaging, nephrology notes, urology notes   Co morbidities that complicate the patient evaluation  A-fib currently on Eliquis  Social Determinants of Health:  Geriatric    Lab Tests:  I Ordered, and personally interpreted labs.  The pertinent results include: CBC unremarkable, CMP shows glucose of 54, BUN 29, creatinine 1.98, CBG 119, UA negative for nitrates leukocytes bacteria lactic 1.7 lipase 21   Imaging Studies ordered:  I ordered imaging studies including CT and pelvis with contrast I independently visualized and interpreted imaging which showed negative acute findings I agree with the radiologist interpretation   Cardiac Monitoring:  The patient was maintained on a cardiac monitor.  I personally viewed and interpreted the cardiac monitored which showed an underlying rhythm of: A-fib without signs of ischemia   Medicines ordered and  prescription drug management:  I ordered medication including N/A I have reviewed the patients home medicines and have made adjustments as needed  Critical Interventions:  N/A   Reevaluation:  Presents with tarry stools and constipation, triage obtain basic lab work-up, all of which were unremarkable, Hemoccult was obtained and was negative, will send patient down for CT CT on pelvis to rule out of obstruction.  CT on pelvis is negative, patient is reassessed resting comfortably he has no complaints he is ready for discharge.  Consultations Obtained:  N/A  Test Considered:  Enema-this was deferred as patient does have a significant mount of stool burden on my rectal exam, he has had no trial of laxatives at this time, suspect this would cause more harm than benefits will send home with outpatient laxatives.    Rule out Low suspicion for GI bleeding as hemodynamic stable, Hemoccult negative, hemoglobin is stable.  I have low suspicion for bowel obstruction, volvulus, intra-abdominal infection, CT imaging are all negative for these findings.  Low suspicion for pancreatitis as lipase within normal limits.  Low suspicion for UTI, pyelo-, kidney stone denies any urinary symptoms, UA is negative for signs infection or hematuria.  Patient did have a low glucose with his CMP he was given juice and a repeat CBG was obtained 119.    Dispostion and problem list  After consideration of the diagnostic results and the patients response to treatment, I feel that the patent would benefit from discharge.  Constipation-likely functional, will recommend laxatives, hydration, exercise follow-up with PCP as needed strict return precautions.            Final Clinical Impression(s) / ED Diagnoses Final diagnoses:  Constipation, unspecified constipation type    Rx / DC Orders ED Discharge Orders     None         Aron Baba 08/22/21 1758    Luna Fuse,  MD 08/31/21 2246

## 2021-08-22 NOTE — ED Triage Notes (Signed)
Reports sent by pcp today for black tarry stools 4-5 days ago.  States no bm since.  Pt is alert and oriented.  Resp even and unlabored.  Skin warm and dry.  nad ?

## 2021-08-22 NOTE — Discharge Instructions (Signed)
Lab work and imaging are reassuring, I recommend taking MiraLAX 1 capful daily for the next 7 days, please drink plenty of water as this medication does not work unless you are hydrated.  Please exercise, and eat foods high in fiber as it help with your constipation. ? ?Follow-up with your PCP for further evaluation ? ?Come back to the emergency department if you develop chest pain, shortness of breath, severe abdominal pain, uncontrolled nausea, vomiting, diarrhea. ? ?

## 2021-08-22 NOTE — ED Provider Triage Note (Signed)
Emergency Medicine Provider Triage Evaluation Note  Craig Black , a 76 y.o. male  was evaluated in triage.   Patient with medical history notable for PAF on Eliquis, COPD, hypertension, CKD stage III presents today due to melenic stool.  4 days ago he had diffuse abdominal pain which is cramping, this was followed by black tarry liquid stool.  He has not had a bowel movement since then but he is passing gas.  Denies any nausea or vomiting, no chest pain or shortness of breath, no syncope.  Has not felt lightheaded.  Has not had any abdominal pain since this bowel movement.  Per chart review patient is status post appendectomy and hernia repair.   Patient is unsure when his last colonoscopy was but states it was normal per his recollection.  Review of Systems  Per HPI  Physical Exam  BP (!) 153/82   Pulse 67   Temp 97.8 F (36.6 C) (Oral)   Resp 20   Ht 5\' 6"  (1.676 m)   Wt 113.4 kg   SpO2 95%   BMI 40.35 kg/m  Gen:   Awake, no distress   Resp:  Normal effort  MSK:   Moves extremities without difficulty  Other:  Abdomen feels distended, there is no focal tenderness rigidity or guarding  Medical Decision Making  Medically screening exam initiated at 3:42 PM.  Appropriate orders placed.  Craig Black was informed that the remainder of the evaluation will be completed by another provider, this initial triage assessment does not replace that evaluation, and the importance of remaining in the ED until their evaluation is complete.     Sherrill Raring, PA-C 08/22/21 1543

## 2021-08-23 DIAGNOSIS — E1165 Type 2 diabetes mellitus with hyperglycemia: Secondary | ICD-10-CM | POA: Diagnosis not present

## 2021-08-23 DIAGNOSIS — I10 Essential (primary) hypertension: Secondary | ICD-10-CM | POA: Diagnosis not present

## 2021-08-23 DIAGNOSIS — R32 Unspecified urinary incontinence: Secondary | ICD-10-CM | POA: Diagnosis not present

## 2021-08-23 DIAGNOSIS — N400 Enlarged prostate without lower urinary tract symptoms: Secondary | ICD-10-CM | POA: Diagnosis not present

## 2021-09-04 ENCOUNTER — Other Ambulatory Visit: Payer: Self-pay

## 2021-09-04 ENCOUNTER — Other Ambulatory Visit: Payer: Self-pay | Admitting: Urology

## 2021-09-04 MED ORDER — SILODOSIN 8 MG PO CAPS: 8.0000 mg | ORAL_CAPSULE | Freq: Every day | ORAL | 11 refills | Status: DC

## 2021-09-06 ENCOUNTER — Telehealth: Payer: Self-pay

## 2021-09-06 NOTE — Telephone Encounter (Signed)
Returned call. Patient referring to medication that was sent in on 09/04/21. Patient state he has taken 1 pill. Patient informed that it can take up to 4 weeks to see results. Patient informed to continue medication as prescribed until his f/u in June. Patient voiced understanding.

## 2021-09-06 NOTE — Telephone Encounter (Signed)
Patient left a voice message:  Prostate Rx isn't working for him. Didn't leave name of Rx.  Please advise.  Call back:  [phone]  Thanks, Helene Kelp

## 2021-09-18 ENCOUNTER — Telehealth: Payer: Self-pay

## 2021-09-18 NOTE — Telephone Encounter (Signed)
Patients wife called and states that the [place] was helping but now it doesn't seem to be working at all.  Patient is having frequent urination and incontinence, is there any other recommendations?  Please advise.

## 2021-09-20 NOTE — Telephone Encounter (Signed)
Left voicemail's informing patient, waiting for c/b for confirmation

## 2021-09-23 DIAGNOSIS — E1165 Type 2 diabetes mellitus with hyperglycemia: Secondary | ICD-10-CM | POA: Diagnosis not present

## 2021-09-23 DIAGNOSIS — I10 Essential (primary) hypertension: Secondary | ICD-10-CM | POA: Diagnosis not present

## 2021-09-24 DIAGNOSIS — N200 Calculus of kidney: Secondary | ICD-10-CM | POA: Diagnosis not present

## 2021-09-24 DIAGNOSIS — N189 Chronic kidney disease, unspecified: Secondary | ICD-10-CM | POA: Diagnosis not present

## 2021-09-24 DIAGNOSIS — E1122 Type 2 diabetes mellitus with diabetic chronic kidney disease: Secondary | ICD-10-CM | POA: Diagnosis not present

## 2021-09-24 DIAGNOSIS — I129 Hypertensive chronic kidney disease with stage 1 through stage 4 chronic kidney disease, or unspecified chronic kidney disease: Secondary | ICD-10-CM | POA: Diagnosis not present

## 2021-09-24 DIAGNOSIS — R809 Proteinuria, unspecified: Secondary | ICD-10-CM | POA: Diagnosis not present

## 2021-09-24 DIAGNOSIS — E1129 Type 2 diabetes mellitus with other diabetic kidney complication: Secondary | ICD-10-CM | POA: Diagnosis not present

## 2021-09-24 DIAGNOSIS — E211 Secondary hyperparathyroidism, not elsewhere classified: Secondary | ICD-10-CM | POA: Diagnosis not present

## 2021-09-26 ENCOUNTER — Telehealth: Payer: Self-pay

## 2021-09-26 NOTE — Telephone Encounter (Signed)
Spoke to [employer] and informed her that Dr. Alyson Ingles wants the patient to drop off a urine sample.  Pt scheduled for ua only on 06/15.

## 2021-09-27 ENCOUNTER — Other Ambulatory Visit: Payer: Medicare HMO

## 2021-09-27 DIAGNOSIS — I129 Hypertensive chronic kidney disease with stage 1 through stage 4 chronic kidney disease, or unspecified chronic kidney disease: Secondary | ICD-10-CM | POA: Diagnosis not present

## 2021-09-27 DIAGNOSIS — N200 Calculus of kidney: Secondary | ICD-10-CM | POA: Diagnosis not present

## 2021-09-27 DIAGNOSIS — E1129 Type 2 diabetes mellitus with other diabetic kidney complication: Secondary | ICD-10-CM | POA: Diagnosis not present

## 2021-09-27 DIAGNOSIS — E1122 Type 2 diabetes mellitus with diabetic chronic kidney disease: Secondary | ICD-10-CM | POA: Diagnosis not present

## 2021-09-27 DIAGNOSIS — R350 Frequency of micturition: Secondary | ICD-10-CM

## 2021-09-27 DIAGNOSIS — R809 Proteinuria, unspecified: Secondary | ICD-10-CM | POA: Diagnosis not present

## 2021-09-27 DIAGNOSIS — E211 Secondary hyperparathyroidism, not elsewhere classified: Secondary | ICD-10-CM | POA: Diagnosis not present

## 2021-09-27 DIAGNOSIS — R300 Dysuria: Secondary | ICD-10-CM | POA: Diagnosis not present

## 2021-09-27 DIAGNOSIS — N189 Chronic kidney disease, unspecified: Secondary | ICD-10-CM | POA: Diagnosis not present

## 2021-09-28 LAB — URINALYSIS, ROUTINE W REFLEX MICROSCOPIC
Bilirubin, UA: NEGATIVE
Ketones, UA: NEGATIVE
Leukocytes,UA: NEGATIVE
Nitrite, UA: NEGATIVE
Specific Gravity, UA: 1.01 (ref 1.005–1.030)
Urobilinogen, Ur: 0.2 mg/dL (ref 0.2–1.0)
pH, UA: 5 (ref 5.0–7.5)

## 2021-09-28 LAB — MICROSCOPIC EXAMINATION
Bacteria, UA: NONE SEEN
Epithelial Cells (non renal): NONE SEEN /hpf (ref 0–10)

## 2021-10-03 ENCOUNTER — Ambulatory Visit (INDEPENDENT_AMBULATORY_CARE_PROVIDER_SITE_OTHER): Payer: Medicare HMO | Admitting: Urology

## 2021-10-03 ENCOUNTER — Encounter: Payer: Self-pay | Admitting: Urology

## 2021-10-03 VITALS — BP 123/67 | HR 93

## 2021-10-03 DIAGNOSIS — R3912 Poor urinary stream: Secondary | ICD-10-CM | POA: Diagnosis not present

## 2021-10-03 DIAGNOSIS — R350 Frequency of micturition: Secondary | ICD-10-CM

## 2021-10-03 DIAGNOSIS — N401 Enlarged prostate with lower urinary tract symptoms: Secondary | ICD-10-CM | POA: Diagnosis not present

## 2021-10-03 DIAGNOSIS — N400 Enlarged prostate without lower urinary tract symptoms: Secondary | ICD-10-CM

## 2021-10-03 LAB — URINALYSIS, ROUTINE W REFLEX MICROSCOPIC
Bilirubin, UA: NEGATIVE
Ketones, UA: NEGATIVE
Leukocytes,UA: NEGATIVE
Nitrite, UA: NEGATIVE
Specific Gravity, UA: 1.01 (ref 1.005–1.030)
Urobilinogen, Ur: 0.2 mg/dL (ref 0.2–1.0)
pH, UA: 5 (ref 5.0–7.5)

## 2021-10-03 LAB — MICROSCOPIC EXAMINATION
Renal Epithel, UA: NONE SEEN /hpf
WBC, UA: NONE SEEN /hpf (ref 0–5)

## 2021-10-03 LAB — BLADDER SCAN AMB NON-IMAGING: Scan Result: 0

## 2021-10-03 NOTE — Patient Instructions (Signed)

## 2021-10-03 NOTE — Progress Notes (Signed)
10/03/2021 2:45 PM   AADARSH WALLY [birth year] [id number]  Referring provider: Benetta Spar, MD [address] [place],  [place] [address]  Followup urinary frequency   HPI: Mr Marcus is a 76yo here for followup for BPH with a weak urine stream and urinary frequency. IPSS 13 QOl 3 on rapaflo 8mg  qhs. PVR 0cc. He has dribbling with urination. Urine stream is strong. Urinary frequency improved to every 2-3 hours. Nocturia 2x. No straining to urinate. No other complaints today   PMH: Past Medical History:  Diagnosis Date   Arthritis    Chronic kidney disease    Chronic sinusitis    COPD (chronic obstructive pulmonary disease) (HCC)    Diabetes mellitus    Gout    Hard of hearing    Right ear   Hyperchloremia    Hypertension    Murmur    Neuropathy in diabetes (HCC)    Obesity    OSA (obstructive sleep apnea)    Paroxysmal A-fib (HCC)    Tobacco abuse     Surgical History: Past Surgical History:  Procedure Laterality Date   APPENDECTOMY     cataract     right lens replacement   EXTRACORPOREAL SHOCK WAVE LITHOTRIPSY Left 12/21/2019   Procedure: EXTRACORPOREAL SHOCK WAVE LITHOTRIPSY (ESWL);  Surgeon: Malen Gauze, MD;  Location: [facility];  Service: Urology;  Laterality: Left;   HERNIA REPAIR     SINUS EXPLORATION     1998    Home Medications:  Allergies as of 10/03/2021   No Known Allergies      Medication List        Accurate as of October 03, 2021  2:45 PM. If you have any questions, ask your nurse or doctor.          albuterol 108 (90 Base) MCG/ACT inhaler Commonly known as: VENTOLIN HFA Inhale 2 puffs into the lungs every 6 (six) hours as needed for wheezing or shortness of breath.   amLODipine 10 MG tablet Commonly known as: NORVASC Take 10 mg by mouth daily.   apixaban 5 MG Tabs tablet Commonly known as: ELIQUIS Take 5 mg by mouth 2 (two) times daily.   calcitRIOL 0.25 MCG capsule Commonly known as: ROCALTROL Take 0.25  mcg by mouth 3 (three) times a week.   carvedilol 6.25 MG tablet Commonly known as: COREG Take 6.25 mg by mouth 2 (two) times daily.   cephALEXin 500 MG capsule Commonly known as: KEFLEX Take 1 capsule (500 mg total) by mouth 2 (two) times daily.   empagliflozin 10 MG Tabs tablet Commonly known as: Jardiance Take 1 tablet (10 mg total) by mouth daily.   finasteride 5 MG tablet Commonly known as: PROSCAR Take 5 mg by mouth daily.   furosemide 20 MG tablet Commonly known as: LASIX Take 40 mg by mouth daily.   insulin aspart protamine- aspart (70-30) 100 UNIT/ML injection Commonly known as: NOVOLOG MIX 70/30 Inject 0.25 mLs (25 Units total) into the skin 2 (two) times daily with a meal.   loratadine 10 MG tablet Commonly known as: CLARITIN Take 10 mg by mouth daily.   NOVOLOG IJ Inject 10 Units as directed 3 (three) times daily as needed (sugar). If blood sugar is high he will add the 10 units to the 55 units of novolog 70/30 55 units   silodosin 8 MG Caps capsule Commonly known as: RAPAFLO TAKE 1 CAPSULE BY MOUTH DAILY WITH BREAKFAST.   Trelegy Ellipta 100-62.5-25 MCG/ACT Aepb  Generic drug: Fluticasone-Umeclidin-Vilant Inhale 1 puff into the lungs daily.        Allergies: No Known Allergies  Family History: Family History  Adopted: Yes    Social History:  reports that he has been smoking cigars. He has never used smokeless tobacco. He reports that he does not currently use alcohol. He reports that he does not use drugs.  ROS: All other review of systems were reviewed and are negative except what is noted above in HPI  Physical Exam: BP 123/67   Pulse 93   Constitutional:  Alert and oriented, No acute distress. HEENT: [place] AT, moist mucus membranes.  Trachea midline, no masses. Cardiovascular: No clubbing, cyanosis, or edema. Respiratory: Normal respiratory effort, no increased work of breathing. GI: Abdomen is soft, nontender, nondistended, no abdominal  masses GU: No CVA tenderness.  Lymph: No cervical or inguinal lymphadenopathy. Skin: No rashes, bruises or suspicious lesions. Neurologic: Grossly intact, no focal deficits, moving all 4 extremities. Psychiatric: Normal mood and affect.  Laboratory Data: Lab Results  Component Value Date   WBC 7.8 08/22/2021   HGB 15.1 08/22/2021   HCT 45.9 08/22/2021   MCV 93.3 08/22/2021   PLT 238 08/22/2021    Lab Results  Component Value Date   CREATININE 1.98 (H) 08/22/2021    No results found for: "PSA"  No results found for: "TESTOSTERONE"  Lab Results  Component Value Date   HGBA1C 6.9 (H) 03/12/2021    Urinalysis    Component Value Date/Time   COLORURINE YELLOW 08/22/2021 1615   APPEARANCEUR Clear 09/27/2021 1615   LABSPEC 1.015 08/22/2021 1615   PHURINE 5.0 08/22/2021 1615   GLUCOSEU 3+ (A) 09/27/2021 1615   HGBUR NEGATIVE 08/22/2021 1615   BILIRUBINUR Negative 09/27/2021 1615   KETONESUR NEGATIVE 08/22/2021 1615   PROTEINUR 1+ (A) 09/27/2021 1615   PROTEINUR 100 (A) 08/22/2021 1615   NITRITE Negative 09/27/2021 1615   NITRITE NEGATIVE 08/22/2021 1615   LEUKOCYTESUR Negative 09/27/2021 1615   LEUKOCYTESUR NEGATIVE 08/22/2021 1615    Lab Results  Component Value Date   LABMICR See below: 09/27/2021   WBCUA 0-5 09/27/2021   LABEPIT None seen 09/27/2021   MUCUS Present 08/15/2021   BACTERIA None seen 09/27/2021    Pertinent Imaging:  Results for orders placed during the hospital encounter of 12/21/19  DG Abd 1 View  Narrative CLINICAL DATA:  Preop evaluation for upcoming lithotripsy  EXAM: ABDOMEN - 1 VIEW  COMPARISON:  12/08/2019  FINDINGS: Scattered large and small bowel gas is noted. Known left renal stone is again identified and stable. Vascular calcifications are noted in the kidneys bilaterally. No free air is noted. Degenerative changes of lumbar spine are seen.  IMPRESSION: Stable small left renal stone.   Electronically Signed By:  Alcide Clever M.D. On: 12/21/2019 08:31  No results found for this or any previous visit.  No results found for this or any previous visit.  No results found for this or any previous visit.  Results for orders placed during the hospital encounter of 04/19/21  US RENAL  Narrative CLINICAL DATA:  Acute renal failure with tubular necrosis. Status post left lithotripsy 12/2019.  EXAM: RENAL / URINARY TRACT ULTRASOUND COMPLETE  COMPARISON:  Renal ultrasound 11/29/2019  FINDINGS: Right Kidney:  Renal measurements: 13.3 x 6.6 x 6.4 = volume: 296 mL. Echogenicity within normal limits. No mass or hydronephrosis visualized.  Left Kidney:  Renal measurements: 13.8 x 4.9 x 6.0 = volume: 208 mL. Echogenicity within normal limits.  No mass or hydronephrosis visualized.  Bladder:  Possible minimal layering echogenic debris within the prevoid urinary bladder. Prevoid urinary bladder volume: 225 mL. Postvoid residual: 41 mL.  Other:  None.  IMPRESSION: 1. Normal appearance of the bilateral kidneys. 2. Mildly increased postvoid urinary bladder volume residual. Possible small amount of debris within the prevoid urinary bladder.   Electronically Signed By: Neita Garnet On: 04/19/2021 17:19  No results found for this or any previous visit.  No results found for this or any previous visit.  No results found for this or any previous visit.   Assessment & Plan:    1. Urinary frequency -continue rapaflo 8mg  daily - Urinalysis, Routine w reflex microscopic - BLADDER SCAN AMB NON-IMAGING  2. Benign prostatic hyperplasia, unspecified whether lower urinary tract symptoms present -continue rapaflo 8mg  daily - Urinalysis, Routine w reflex microscopic - BLADDER SCAN AMB NON-IMAGING  3. Weak urinary stream Continue rapaflo 8mg  daily and finasteride 5mg  daily   No follow-ups on file.  Wilkie Aye, MD  [facility]

## 2021-10-03 NOTE — Progress Notes (Unsigned)
post void residual=0 ?

## 2021-10-09 MED ORDER — FINASTERIDE 5 MG PO TABS: 5.0000 mg | ORAL_TABLET | Freq: Every day | ORAL | 3 refills | Status: DC

## 2021-10-09 MED ORDER — SILODOSIN 8 MG PO CAPS: 8.0000 mg | ORAL_CAPSULE | Freq: Every day | ORAL | 11 refills | Status: DC

## 2021-10-23 DIAGNOSIS — E1165 Type 2 diabetes mellitus with hyperglycemia: Secondary | ICD-10-CM | POA: Diagnosis not present

## 2021-10-23 DIAGNOSIS — I10 Essential (primary) hypertension: Secondary | ICD-10-CM | POA: Diagnosis not present

## 2021-11-23 DIAGNOSIS — I10 Essential (primary) hypertension: Secondary | ICD-10-CM | POA: Diagnosis not present

## 2021-11-23 DIAGNOSIS — E1165 Type 2 diabetes mellitus with hyperglycemia: Secondary | ICD-10-CM | POA: Diagnosis not present

## 2021-11-28 ENCOUNTER — Ambulatory Visit: Payer: Medicare HMO | Admitting: Urology

## 2021-12-06 DIAGNOSIS — M9903 Segmental and somatic dysfunction of lumbar region: Secondary | ICD-10-CM | POA: Diagnosis not present

## 2021-12-06 DIAGNOSIS — M9905 Segmental and somatic dysfunction of pelvic region: Secondary | ICD-10-CM | POA: Diagnosis not present

## 2021-12-06 DIAGNOSIS — M9904 Segmental and somatic dysfunction of sacral region: Secondary | ICD-10-CM | POA: Diagnosis not present

## 2021-12-06 DIAGNOSIS — M62830 Muscle spasm of back: Secondary | ICD-10-CM | POA: Diagnosis not present

## 2021-12-10 DIAGNOSIS — M9905 Segmental and somatic dysfunction of pelvic region: Secondary | ICD-10-CM | POA: Diagnosis not present

## 2021-12-10 DIAGNOSIS — M9903 Segmental and somatic dysfunction of lumbar region: Secondary | ICD-10-CM | POA: Diagnosis not present

## 2021-12-10 DIAGNOSIS — E211 Secondary hyperparathyroidism, not elsewhere classified: Secondary | ICD-10-CM | POA: Diagnosis not present

## 2021-12-10 DIAGNOSIS — M62830 Muscle spasm of back: Secondary | ICD-10-CM | POA: Diagnosis not present

## 2021-12-10 DIAGNOSIS — N189 Chronic kidney disease, unspecified: Secondary | ICD-10-CM | POA: Diagnosis not present

## 2021-12-10 DIAGNOSIS — N200 Calculus of kidney: Secondary | ICD-10-CM | POA: Diagnosis not present

## 2021-12-10 DIAGNOSIS — R809 Proteinuria, unspecified: Secondary | ICD-10-CM | POA: Diagnosis not present

## 2021-12-10 DIAGNOSIS — E1122 Type 2 diabetes mellitus with diabetic chronic kidney disease: Secondary | ICD-10-CM | POA: Diagnosis not present

## 2021-12-10 DIAGNOSIS — I129 Hypertensive chronic kidney disease with stage 1 through stage 4 chronic kidney disease, or unspecified chronic kidney disease: Secondary | ICD-10-CM | POA: Diagnosis not present

## 2021-12-10 DIAGNOSIS — M9904 Segmental and somatic dysfunction of sacral region: Secondary | ICD-10-CM | POA: Diagnosis not present

## 2021-12-10 DIAGNOSIS — E1129 Type 2 diabetes mellitus with other diabetic kidney complication: Secondary | ICD-10-CM | POA: Diagnosis not present

## 2021-12-12 DIAGNOSIS — M9905 Segmental and somatic dysfunction of pelvic region: Secondary | ICD-10-CM | POA: Diagnosis not present

## 2021-12-12 DIAGNOSIS — M9904 Segmental and somatic dysfunction of sacral region: Secondary | ICD-10-CM | POA: Diagnosis not present

## 2021-12-12 DIAGNOSIS — M62830 Muscle spasm of back: Secondary | ICD-10-CM | POA: Diagnosis not present

## 2021-12-12 DIAGNOSIS — M9903 Segmental and somatic dysfunction of lumbar region: Secondary | ICD-10-CM | POA: Diagnosis not present

## 2021-12-13 DIAGNOSIS — R339 Retention of urine, unspecified: Secondary | ICD-10-CM | POA: Diagnosis not present

## 2021-12-13 DIAGNOSIS — I129 Hypertensive chronic kidney disease with stage 1 through stage 4 chronic kidney disease, or unspecified chronic kidney disease: Secondary | ICD-10-CM | POA: Diagnosis not present

## 2021-12-13 DIAGNOSIS — N189 Chronic kidney disease, unspecified: Secondary | ICD-10-CM | POA: Diagnosis not present

## 2021-12-13 DIAGNOSIS — E1129 Type 2 diabetes mellitus with other diabetic kidney complication: Secondary | ICD-10-CM | POA: Diagnosis not present

## 2021-12-13 DIAGNOSIS — E1122 Type 2 diabetes mellitus with diabetic chronic kidney disease: Secondary | ICD-10-CM | POA: Diagnosis not present

## 2021-12-13 DIAGNOSIS — R809 Proteinuria, unspecified: Secondary | ICD-10-CM | POA: Diagnosis not present

## 2021-12-13 DIAGNOSIS — E211 Secondary hyperparathyroidism, not elsewhere classified: Secondary | ICD-10-CM | POA: Diagnosis not present

## 2021-12-20 DIAGNOSIS — M9903 Segmental and somatic dysfunction of lumbar region: Secondary | ICD-10-CM | POA: Diagnosis not present

## 2021-12-20 DIAGNOSIS — M62830 Muscle spasm of back: Secondary | ICD-10-CM | POA: Diagnosis not present

## 2021-12-20 DIAGNOSIS — M9904 Segmental and somatic dysfunction of sacral region: Secondary | ICD-10-CM | POA: Diagnosis not present

## 2021-12-20 DIAGNOSIS — M9905 Segmental and somatic dysfunction of pelvic region: Secondary | ICD-10-CM | POA: Diagnosis not present

## 2021-12-24 DIAGNOSIS — M9905 Segmental and somatic dysfunction of pelvic region: Secondary | ICD-10-CM | POA: Diagnosis not present

## 2021-12-24 DIAGNOSIS — M9904 Segmental and somatic dysfunction of sacral region: Secondary | ICD-10-CM | POA: Diagnosis not present

## 2021-12-24 DIAGNOSIS — M9903 Segmental and somatic dysfunction of lumbar region: Secondary | ICD-10-CM | POA: Diagnosis not present

## 2021-12-24 DIAGNOSIS — M62830 Muscle spasm of back: Secondary | ICD-10-CM | POA: Diagnosis not present

## 2021-12-25 DIAGNOSIS — R339 Retention of urine, unspecified: Secondary | ICD-10-CM | POA: Diagnosis not present

## 2021-12-25 DIAGNOSIS — E1129 Type 2 diabetes mellitus with other diabetic kidney complication: Secondary | ICD-10-CM | POA: Diagnosis not present

## 2021-12-25 DIAGNOSIS — I129 Hypertensive chronic kidney disease with stage 1 through stage 4 chronic kidney disease, or unspecified chronic kidney disease: Secondary | ICD-10-CM | POA: Diagnosis not present

## 2021-12-25 DIAGNOSIS — R809 Proteinuria, unspecified: Secondary | ICD-10-CM | POA: Diagnosis not present

## 2021-12-25 DIAGNOSIS — E1122 Type 2 diabetes mellitus with diabetic chronic kidney disease: Secondary | ICD-10-CM | POA: Diagnosis not present

## 2021-12-25 DIAGNOSIS — E211 Secondary hyperparathyroidism, not elsewhere classified: Secondary | ICD-10-CM | POA: Diagnosis not present

## 2021-12-25 DIAGNOSIS — N189 Chronic kidney disease, unspecified: Secondary | ICD-10-CM | POA: Diagnosis not present

## 2021-12-26 DIAGNOSIS — M62830 Muscle spasm of back: Secondary | ICD-10-CM | POA: Diagnosis not present

## 2021-12-26 DIAGNOSIS — M9903 Segmental and somatic dysfunction of lumbar region: Secondary | ICD-10-CM | POA: Diagnosis not present

## 2021-12-26 DIAGNOSIS — M9905 Segmental and somatic dysfunction of pelvic region: Secondary | ICD-10-CM | POA: Diagnosis not present

## 2021-12-26 DIAGNOSIS — M9904 Segmental and somatic dysfunction of sacral region: Secondary | ICD-10-CM | POA: Diagnosis not present

## 2021-12-27 DIAGNOSIS — M9905 Segmental and somatic dysfunction of pelvic region: Secondary | ICD-10-CM | POA: Diagnosis not present

## 2021-12-27 DIAGNOSIS — M9903 Segmental and somatic dysfunction of lumbar region: Secondary | ICD-10-CM | POA: Diagnosis not present

## 2021-12-27 DIAGNOSIS — M9904 Segmental and somatic dysfunction of sacral region: Secondary | ICD-10-CM | POA: Diagnosis not present

## 2021-12-27 DIAGNOSIS — M62830 Muscle spasm of back: Secondary | ICD-10-CM | POA: Diagnosis not present

## 2022-01-02 DIAGNOSIS — M9904 Segmental and somatic dysfunction of sacral region: Secondary | ICD-10-CM | POA: Diagnosis not present

## 2022-01-02 DIAGNOSIS — M62830 Muscle spasm of back: Secondary | ICD-10-CM | POA: Diagnosis not present

## 2022-01-02 DIAGNOSIS — M9905 Segmental and somatic dysfunction of pelvic region: Secondary | ICD-10-CM | POA: Diagnosis not present

## 2022-01-02 DIAGNOSIS — M9903 Segmental and somatic dysfunction of lumbar region: Secondary | ICD-10-CM | POA: Diagnosis not present

## 2022-01-03 DIAGNOSIS — M9903 Segmental and somatic dysfunction of lumbar region: Secondary | ICD-10-CM | POA: Diagnosis not present

## 2022-01-03 DIAGNOSIS — M9905 Segmental and somatic dysfunction of pelvic region: Secondary | ICD-10-CM | POA: Diagnosis not present

## 2022-01-03 DIAGNOSIS — M9904 Segmental and somatic dysfunction of sacral region: Secondary | ICD-10-CM | POA: Diagnosis not present

## 2022-01-03 DIAGNOSIS — M62830 Muscle spasm of back: Secondary | ICD-10-CM | POA: Diagnosis not present

## 2022-01-08 ENCOUNTER — Ambulatory Visit (INDEPENDENT_AMBULATORY_CARE_PROVIDER_SITE_OTHER): Payer: Medicare HMO | Admitting: Physician Assistant

## 2022-01-08 VITALS — BP 137/73 | HR 73

## 2022-01-08 DIAGNOSIS — R351 Nocturia: Secondary | ICD-10-CM | POA: Diagnosis not present

## 2022-01-08 DIAGNOSIS — N400 Enlarged prostate without lower urinary tract symptoms: Secondary | ICD-10-CM

## 2022-01-08 DIAGNOSIS — R3912 Poor urinary stream: Secondary | ICD-10-CM | POA: Diagnosis not present

## 2022-01-08 DIAGNOSIS — R350 Frequency of micturition: Secondary | ICD-10-CM | POA: Diagnosis not present

## 2022-01-08 LAB — BLADDER SCAN AMB NON-IMAGING: Scan Result: 0

## 2022-01-08 NOTE — Progress Notes (Signed)
Assessment: 1. Benign prostatic hyperplasia, unspecified whether lower urinary tract symptoms present - BLADDER SCAN AMB NON-IMAGING  2. Urinary frequency - BLADDER SCAN AMB NON-IMAGING - Urinalysis, Routine w reflex microscopic  3. Weak urinary stream - BLADDER SCAN AMB NON-IMAGING  4. Nocturia  Meds ordered this encounter  Medications   Vibegron (GEMTESA) 75 MG TABS    Sig: Take 75 mg by mouth daily.    Dispense:  28 tablet    Refill:  0      Plan: Discuss Jardiance with PCP and explained, in detail, to patient how this may be contributing to frequency. Pt not wearing CPAP-long discussion about this and the relation to nocturia. Will continue finasteride and rapaflo. Samples of Gemtesa given and discussed medication including Ses. Pt given written and verbal instructions. FU in 1 month for UA and PVR  Chief Complaint: No chief complaint on file.   HPI: Craig Black is a 76 y.o. male with history of CKD, hypertension, diabetes mellitus, COPD, atrial fibrillation and OSA who presents for continued evaluation of urinary frequency, BPH, weakness of urinary stream. Pt currently on Rapaflo and finasteride.  Patient continues to complain of urinary frequency every 1-2 hours with nocturia. No straining. Recently strarted Jardiance and admits not wearing CPAP rx for OSA. No gross hematuria, dysuria. Glucose levels remain elevated.  UA= 2+ glucose, 3+ protein, otherwise clear PVR=11ml IPSS=20  QOL=6  10/03/21 Craig Black is a 75yo here for followup for BPH with a weak urine stream and urinary frequency. IPSS 13 QOl 3 on rapaflo 8mg  qhs. PVR 0cc. He has dribbling with urination. Urine stream is strong. Urinary frequency improved to every 2-3 hours. Nocturia 2x. No straining to urinate. No other complaints today  08/15/21 Craig Black is 62ZC here for evaluation of urinary frequency and a weak urinary stream. For the past 4-5 months he has noted progressive LUTS. IPSS 23 QOL 6. He has been on  lasix for over 1 year for lower extremity edema. He has CKD3. Nocturia 4-5x, urinary frequency every 1 hour. His urine stream is weak. He has numerous urge incontinent episodes per day. He uses 3-4 pads per day which are soaked. He started jardiance 3 weeks ago. PVR 83cc.      Portions of the above documentation were copied from a prior visit for review purposes only.  Allergies: No Known Allergies  PMH: Past Medical History:  Diagnosis Date   Arthritis    Chronic kidney disease    Chronic sinusitis    COPD (chronic obstructive pulmonary disease) (HCC)    Diabetes mellitus    Gout    Hard of hearing    Right ear   Hyperchloremia    Hypertension    Murmur    Neuropathy in diabetes ([place])    Obesity    OSA (obstructive sleep apnea)    Paroxysmal A-fib (HCC)    Tobacco abuse     PSH: Past Surgical History:  Procedure Laterality Date   APPENDECTOMY     cataract     right lens replacement   EXTRACORPOREAL SHOCK WAVE LITHOTRIPSY Left 12/21/2019   Procedure: EXTRACORPOREAL SHOCK WAVE LITHOTRIPSY (ESWL);  Surgeon: Cleon Gustin, MD;  Location: [facility];  Service: Urology;  Laterality: Left;   HERNIA REPAIR     SINUS EXPLORATION     1998    SH: Social History   Tobacco Use   Smoking status: Every Day    Types: Cigars   Smokeless tobacco: Never  Tobacco comments:    3 cigars daily   Vaping Use   Vaping Use: Never used  Substance Use Topics   Alcohol use: Not Currently   Drug use: No    ROS: All other review of systems were reviewed and are negative except what is noted above in HPI  PE: BP 137/73   Pulse 73  GENERAL APPEARANCE:  Well appearing, well developed, well nourished, NAD HEENT:  Atraumatic, normocephalic NECK:  Supple. Trachea midline ABDOMEN:  Soft, non-tender, no masses EXTREMITIES:  Moves all extremities well] NEUROLOGIC:  Alert and oriented x 3 MENTAL STATUS:  appropriate BACK:  Non-tender to palpation, No CVAT SKIN:  Warm, dry, and  intact   Results: Laboratory Data: Lab Results  Component Value Date   WBC 7.8 08/22/2021   HGB 15.1 08/22/2021   HCT 45.9 08/22/2021   MCV 93.3 08/22/2021   PLT 238 08/22/2021    Lab Results  Component Value Date   CREATININE 1.98 (H) 08/22/2021    No results found for: "PSA"  No results found for: "TESTOSTERONE"  Lab Results  Component Value Date   HGBA1C 6.9 (H) 03/12/2021    Urinalysis    Component Value Date/Time   COLORURINE YELLOW 08/22/2021 1615   APPEARANCEUR Clear 01/08/2022 1608   LABSPEC 1.015 08/22/2021 1615   PHURINE 5.0 08/22/2021 1615   GLUCOSEU 2+ (A) 01/08/2022 1608   HGBUR NEGATIVE 08/22/2021 1615   BILIRUBINUR Negative 01/08/2022 1608   KETONESUR NEGATIVE 08/22/2021 1615   PROTEINUR 3+ (A) 01/08/2022 1608   PROTEINUR 100 (A) 08/22/2021 1615   NITRITE Negative 01/08/2022 1608   NITRITE NEGATIVE 08/22/2021 1615   LEUKOCYTESUR Negative 01/08/2022 1608   LEUKOCYTESUR NEGATIVE 08/22/2021 1615    Lab Results  Component Value Date   LABMICR See below: 01/08/2022   WBCUA 0-5 01/08/2022   LABEPIT 0-10 01/08/2022   MUCUS Present 10/03/2021   BACTERIA None seen 01/08/2022    Pertinent Imaging: Results for orders placed during the hospital encounter of 12/21/19  DG Abd 1 View  Narrative CLINICAL DATA:  Preop evaluation for upcoming lithotripsy  EXAM: ABDOMEN - 1 VIEW  COMPARISON:  12/08/2019  FINDINGS: Scattered large and small bowel gas is noted. Known left renal stone is again identified and stable. Vascular calcifications are noted in the kidneys bilaterally. No free air is noted. Degenerative changes of lumbar spine are seen.  IMPRESSION: Stable small left renal stone.   Electronically Signed By: Inez Catalina M.D. On: 12/21/2019 08:31  No results found for this or any previous visit.  No results found for this or any previous visit.  No results found for this or any previous visit.  Results for orders placed during  the hospital encounter of 04/19/21  US RENAL  Narrative CLINICAL DATA:  Acute renal failure with tubular necrosis. Status post left lithotripsy 12/2019.  EXAM: RENAL / URINARY TRACT ULTRASOUND COMPLETE  COMPARISON:  Renal ultrasound 11/29/2019  FINDINGS: Right Kidney:  Renal measurements: 13.3 x 6.6 x 6.4 = volume: 296 mL. Echogenicity within normal limits. No mass or hydronephrosis visualized.  Left Kidney:  Renal measurements: 13.8 x 4.9 x 6.0 = volume: 208 mL. Echogenicity within normal limits. No mass or hydronephrosis visualized.  Bladder:  Possible minimal layering echogenic debris within the prevoid urinary bladder. Prevoid urinary bladder volume: 225 mL. Postvoid residual: 41 mL.  Other:  None.  IMPRESSION: 1. Normal appearance of the bilateral kidneys. 2. Mildly increased postvoid urinary bladder volume residual. Possible small amount of  debris within the prevoid urinary bladder.   Electronically Signed By: Yvonne Kendall On: 04/19/2021 17:19  No valid procedures specified. No results found for this or any previous visit.  No results found for this or any previous visit.  No results found for this or any previous visit (from the past 24 hour(s)).

## 2022-01-08 NOTE — Progress Notes (Signed)
post void residual=0 ?

## 2022-01-08 NOTE — Patient Instructions (Addendum)
Ask Dr. Maylene Roes about Craig Black. Can you stop? It may be increasing urinary frequency  Are you supposed to be wearing CPAP for sleep apnea?   Stay on finasteride daily   Stay on Rapaflo daily  Gemtesa samples- One tablet daily

## 2022-01-09 DIAGNOSIS — M9904 Segmental and somatic dysfunction of sacral region: Secondary | ICD-10-CM | POA: Diagnosis not present

## 2022-01-09 DIAGNOSIS — M9905 Segmental and somatic dysfunction of pelvic region: Secondary | ICD-10-CM | POA: Diagnosis not present

## 2022-01-09 DIAGNOSIS — M9903 Segmental and somatic dysfunction of lumbar region: Secondary | ICD-10-CM | POA: Diagnosis not present

## 2022-01-09 DIAGNOSIS — M62830 Muscle spasm of back: Secondary | ICD-10-CM | POA: Diagnosis not present

## 2022-01-09 LAB — URINALYSIS, ROUTINE W REFLEX MICROSCOPIC
Bilirubin, UA: NEGATIVE
Ketones, UA: NEGATIVE
Leukocytes,UA: NEGATIVE
Nitrite, UA: NEGATIVE
Specific Gravity, UA: 1.025 (ref 1.005–1.030)
Urobilinogen, Ur: 0.2 mg/dL (ref 0.2–1.0)
pH, UA: 5 (ref 5.0–7.5)

## 2022-01-09 LAB — MICROSCOPIC EXAMINATION: Bacteria, UA: NONE SEEN

## 2022-01-12 MED ORDER — GEMTESA 75 MG PO TABS: 75.0000 mg | ORAL_TABLET | Freq: Every day | ORAL | 0 refills | Status: DC

## 2022-01-31 ENCOUNTER — Encounter: Payer: Self-pay | Admitting: *Deleted

## 2022-02-06 ENCOUNTER — Ambulatory Visit: Payer: Medicare HMO | Admitting: Urology

## 2022-02-07 DIAGNOSIS — E211 Secondary hyperparathyroidism, not elsewhere classified: Secondary | ICD-10-CM | POA: Diagnosis not present

## 2022-02-07 DIAGNOSIS — R339 Retention of urine, unspecified: Secondary | ICD-10-CM | POA: Diagnosis not present

## 2022-02-07 DIAGNOSIS — I129 Hypertensive chronic kidney disease with stage 1 through stage 4 chronic kidney disease, or unspecified chronic kidney disease: Secondary | ICD-10-CM | POA: Diagnosis not present

## 2022-02-07 DIAGNOSIS — E1129 Type 2 diabetes mellitus with other diabetic kidney complication: Secondary | ICD-10-CM | POA: Diagnosis not present

## 2022-02-07 DIAGNOSIS — R809 Proteinuria, unspecified: Secondary | ICD-10-CM | POA: Diagnosis not present

## 2022-02-07 DIAGNOSIS — N189 Chronic kidney disease, unspecified: Secondary | ICD-10-CM | POA: Diagnosis not present

## 2022-02-07 DIAGNOSIS — E1122 Type 2 diabetes mellitus with diabetic chronic kidney disease: Secondary | ICD-10-CM | POA: Diagnosis not present

## 2022-02-13 DIAGNOSIS — E211 Secondary hyperparathyroidism, not elsewhere classified: Secondary | ICD-10-CM | POA: Diagnosis not present

## 2022-02-13 DIAGNOSIS — R809 Proteinuria, unspecified: Secondary | ICD-10-CM | POA: Diagnosis not present

## 2022-02-13 DIAGNOSIS — E1122 Type 2 diabetes mellitus with diabetic chronic kidney disease: Secondary | ICD-10-CM | POA: Diagnosis not present

## 2022-02-13 DIAGNOSIS — I129 Hypertensive chronic kidney disease with stage 1 through stage 4 chronic kidney disease, or unspecified chronic kidney disease: Secondary | ICD-10-CM | POA: Diagnosis not present

## 2022-02-13 DIAGNOSIS — N189 Chronic kidney disease, unspecified: Secondary | ICD-10-CM | POA: Diagnosis not present

## 2022-02-13 DIAGNOSIS — E1129 Type 2 diabetes mellitus with other diabetic kidney complication: Secondary | ICD-10-CM | POA: Diagnosis not present

## 2022-02-18 DIAGNOSIS — F17210 Nicotine dependence, cigarettes, uncomplicated: Secondary | ICD-10-CM | POA: Diagnosis not present

## 2022-02-18 DIAGNOSIS — I4821 Permanent atrial fibrillation: Secondary | ICD-10-CM | POA: Diagnosis not present

## 2022-02-18 DIAGNOSIS — E1165 Type 2 diabetes mellitus with hyperglycemia: Secondary | ICD-10-CM | POA: Diagnosis not present

## 2022-02-18 DIAGNOSIS — Z0001 Encounter for general adult medical examination with abnormal findings: Secondary | ICD-10-CM | POA: Diagnosis not present

## 2022-02-18 DIAGNOSIS — J449 Chronic obstructive pulmonary disease, unspecified: Secondary | ICD-10-CM | POA: Diagnosis not present

## 2022-02-18 DIAGNOSIS — Z1389 Encounter for screening for other disorder: Secondary | ICD-10-CM | POA: Diagnosis not present

## 2022-02-18 DIAGNOSIS — I10 Essential (primary) hypertension: Secondary | ICD-10-CM | POA: Diagnosis not present

## 2022-02-18 DIAGNOSIS — Z1331 Encounter for screening for depression: Secondary | ICD-10-CM | POA: Diagnosis not present

## 2022-03-06 ENCOUNTER — Ambulatory Visit (INDEPENDENT_AMBULATORY_CARE_PROVIDER_SITE_OTHER): Payer: Medicare HMO | Admitting: Gastroenterology

## 2022-03-06 ENCOUNTER — Encounter: Payer: Self-pay | Admitting: Gastroenterology

## 2022-03-06 VITALS — BP 148/79 | HR 94 | Temp 97.7°F | Ht 66.0 in | Wt 253.9 lb

## 2022-03-06 DIAGNOSIS — K219 Gastro-esophageal reflux disease without esophagitis: Secondary | ICD-10-CM

## 2022-03-06 DIAGNOSIS — Z86010 Personal history of colonic polyps: Secondary | ICD-10-CM | POA: Diagnosis not present

## 2022-03-06 NOTE — Patient Instructions (Addendum)
Take pantoprazole once daily, 30 minutes before breakfast. This is for reflux.  I am getting the reports from the [place] to see last colonoscopy and endoscopy.   We are scheduling you for the colonoscopy with Dr. Gala Romney in the near future.   STOP ELIQUIS 48 hours before the procedure. NO insulin morning of procedure.   We will see you in 6 months!  It was a pleasure to see you today. I want to create trusting relationships with patients to provide genuine, compassionate, and quality care. I value your feedback. If you receive a survey regarding your visit,  I greatly appreciate you taking time to fill this out.   Annitta Needs, PhD, ANP-BC [facility]

## 2022-03-06 NOTE — Progress Notes (Signed)
Gastroenterology Office Note    Referring Provider: Hedda Slade Primary Care Physician:  [facility] [place]  Primary GI: Dr. Gala Romney    Chief Complaint   Chief Complaint  Patient presents with   Colon Cancer Screening    New patient. Referred due to age and needs colonoscopy. Patient is with care giver Graciella Belton.      History of Present Illness   KERI TAVELLA is a 76 y.o. male presenting today at the request of [facility] [place] due to need for surveillance colonoscopy. Per notes sent from the [place], he had a colonoscopy in 2019 with polyps and need for 3 year surveillance. I do not have any of this at the time of the appointment.   Patient is here with caregiver, Graciella Belton, who is also his significant other. He and caregiver are limited historians. He does report that last colonoscopy was around 2019 and had "18 polyps" removed.   No abdominal pain, N/V, changes in bowel habits, constipation, diarrhea, overt GI bleeding, dysphagia, unexplained weight loss, lack of appetite, unexplained weight gain.   He does note uncontrolled GERD but no dysphagia. Reported EGD at the [facility]. Denies any Barrett's esophagus. NOT on a PPI.   1 carbonated drink a day. Drinks coffee.       Past Medical History:  Diagnosis Date   Arthritis    Chronic kidney disease    Chronic sinusitis    COPD (chronic obstructive pulmonary disease) (HCC)    Diabetes mellitus    Gout    Hard of hearing    Right ear   Hyperchloremia    Hypertension    Murmur    Neuropathy in diabetes ([place])    Obesity    OSA (obstructive sleep apnea)    Paroxysmal A-fib ([place])    Tobacco abuse     Past Surgical History:  Procedure Laterality Date   APPENDECTOMY     cataract     right lens replacement   EXTRACORPOREAL SHOCK WAVE LITHOTRIPSY Left 12/21/2019   Procedure: EXTRACORPOREAL SHOCK WAVE LITHOTRIPSY (ESWL);  Surgeon: Cleon Gustin, MD;  Location: [facility];  Service: Urology;  Laterality:  Left;   HERNIA REPAIR     SINUS EXPLORATION     1998    Current Outpatient Medications  Medication Sig Dispense Refill   albuterol (VENTOLIN HFA) 108 (90 Base) MCG/ACT inhaler Inhale 2 puffs into the lungs every 6 (six) hours as needed for wheezing or shortness of breath.     amLODipine (NORVASC) 10 MG tablet Take 10 mg by mouth daily.     apixaban (ELIQUIS) 5 MG TABS tablet Take 5 mg by mouth 2 (two) times daily.     calcitRIOL (ROCALTROL) 0.25 MCG capsule Take 0.25 mcg by mouth 3 (three) times a week.     carvedilol (COREG) 6.25 MG tablet Take 6.25 mg by mouth 2 (two) times daily.     finasteride (PROSCAR) 5 MG tablet Take 1 tablet (5 mg total) by mouth daily. 90 tablet 3   furosemide (LASIX) 20 MG tablet Take 40 mg by mouth daily.     Insulin Aspart (NOVOLOG IJ) Inject 10 Units as directed 3 (three) times daily as needed (sugar). If blood sugar is high he will add the 10 units to the 55 units of novolog 70/30 55 units     insulin aspart protamine- aspart (NOVOLOG MIX 70/30) (70-30) 100 UNIT/ML injection Inject 0.25 mLs (25 Units total) into the skin  2 (two) times daily with a meal. 10 mL 0   loratadine (CLARITIN) 10 MG tablet Take 10 mg by mouth daily.     silodosin (RAPAFLO) 8 MG CAPS capsule Take 1 capsule (8 mg total) by mouth daily with breakfast. 30 capsule 11   Vibegron (GEMTESA) 75 MG TABS Take 75 mg by mouth daily. 28 tablet 0   No current facility-administered medications for this visit.    Allergies as of 03/06/2022   (No Known Allergies)    Family History  Adopted: Yes  Problem Relation Age of Onset   Colon cancer Neg Hx    Colon polyps Neg Hx     Social History   Socioeconomic History   Marital status: Widowed    Spouse name: Not on file   Number of children: Not on file   Years of education: Not on file   Highest education level: Not on file  Occupational History   Occupation: retired  Tobacco Use   Smoking status: Every Day    Types: Cigars    Passive  exposure: Current   Smokeless tobacco: Never   Tobacco comments:    4-5 cigars daily  Vaping Use   Vaping Use: Never used  Substance and Sexual Activity   Alcohol use: Not Currently   Drug use: No   Sexual activity: Not on file  Other Topics Concern   Not on file  Social History Narrative   Not on file   Social Determinants of Health   Financial Resource Strain: Not on file  Food Insecurity: Not on file  Transportation Needs: Not on file  Physical Activity: Not on file  Stress: Not on file  Social Connections: Not on file  Intimate Partner Violence: Not on file     Review of Systems   Gen: Denies any fever, chills, fatigue, weight loss, lack of appetite.  CV: Denies chest pain, heart palpitations, peripheral edema, syncope.  Resp: Denies shortness of breath at rest or with exertion. Denies wheezing or cough.  GI: see HPI GU : Denies urinary burning, urinary frequency, urinary hesitancy MS: Denies joint pain, muscle weakness, cramps, or limitation of movement.  Derm: Denies rash, itching, dry skin Psych: Denies depression, anxiety, memory loss, and confusion Heme: Denies bruising, bleeding, and enlarged lymph nodes.   Physical Exam   BP (!) 148/79 (BP Location: Right Arm, Patient Position: Sitting, Cuff Size: Large)   Pulse 94   Temp 97.7 F (36.5 C) (Oral)   Ht 5\' 6"  (1.676 m)   Wt 253 lb 14.4 oz (115.2 kg)   BMI 40.98 kg/m  General:   Alert and oriented. Pleasant and cooperative. Well-nourished and well-developed.  Head:  Normocephalic and atraumatic. Eyes:  Without icterus Ears:  VERY HARD OR HEARING Lungs:  Clear to auscultation bilaterally.  Heart:  S1, S2 present without murmurs appreciated.  Abdomen:  +BS, obese, soft, non-tender and non-distended. Difficult to appreciate HSM due to larger AP diameter.  Rectal:  Deferred  Msk:  Symmetrical without gross deformities. Normal posture. Extremities:  Without edema. Neurologic:  Alert and  oriented  x4; Skin:  Intact without significant lesions or rashes. Psych:  Alert and cooperative. Normal mood and affect.   Assessment   RAMESES OU is a 76 y.o. male presenting today at the request of [facility] [place] due to need for surveillance colonoscopy. Per notes sent from the [place], he had a colonoscopy in 2019 with polyps and need for 3 year surveillance. I do  not have any of this at the time of the appointment.   Colon polyps: patient reports "18 polyps" removed in 2019 from [place], [place]. We will attempt to retrieve this. He is somewhat a limited historian. Hard of hearing also makes challenging. However, denies any lower GI concerns.  GERD: uncontrolled. He is not on a PPI and has not been ever, per patient and significant other. No dysphagia. Possible EGD in 2019. Will request records.      PLAN    Proceed with colonoscopy by Dr. Gala Romney in near future: the risks, benefits, and alternatives have been discussed with the patient in detail. The patient states understanding and desires to proceed. ASA 3  HOLD ELIQUIS 48 hours prior  START pantoprazole once daily  Attempt to retrieve outside records  Return in 6 months regardless  Annitta Needs, PhD, [facility]

## 2022-03-14 ENCOUNTER — Telehealth (INDEPENDENT_AMBULATORY_CARE_PROVIDER_SITE_OTHER): Payer: Self-pay | Admitting: *Deleted

## 2022-03-14 NOTE — Telephone Encounter (Signed)
LMOVM to call back to schedule TCS with Dr. Gala Romney, ASA 3, hold eliquis 48 hrs prior, HALF dose DM meds night prior, none day of

## 2022-03-20 DIAGNOSIS — E1165 Type 2 diabetes mellitus with hyperglycemia: Secondary | ICD-10-CM | POA: Diagnosis not present

## 2022-03-20 DIAGNOSIS — I10 Essential (primary) hypertension: Secondary | ICD-10-CM | POA: Diagnosis not present

## 2022-04-03 ENCOUNTER — Encounter: Payer: Self-pay | Admitting: *Deleted

## 2022-04-03 MED ORDER — PEG 3350-KCL-NA BICARB-NACL 420 G PO SOLR: 4000.0000 mL | Freq: Once | ORAL | 0 refills | Status: AC

## 2022-04-03 NOTE — Telephone Encounter (Signed)
Pt has been scheduled for 04/25/22 at 10 am, instructions mailed and prep sent to the pharmacy.  [employer] PA: Approved Authorization #[id number]  Tracking #[id number]  DOS: 04/03/22-06/07/22

## 2022-04-04 ENCOUNTER — Encounter: Payer: Self-pay | Admitting: *Deleted

## 2022-04-20 DIAGNOSIS — I10 Essential (primary) hypertension: Secondary | ICD-10-CM | POA: Diagnosis not present

## 2022-04-20 DIAGNOSIS — J449 Chronic obstructive pulmonary disease, unspecified: Secondary | ICD-10-CM | POA: Diagnosis not present

## 2022-04-22 NOTE — Patient Instructions (Signed)
Craig Black  04/22/2022     @PREFPERIOPPHARMACY @   Your procedure is scheduled on 04/25/2022.   Report to [name] at  [facility]   Call this number if you have problems the morning of surgery:  [phone]  If you experience any cold or flu symptoms such as cough, fever, chills, shortness of breath, etc. between now and your scheduled surgery, please notify us at the above number.   Remember:  Follow the diet and prep instructions given to you by the office.      Use your inhaler before you come and bring your rescue inhaler with you.      Your last dose of eliquis should be 04/22/2022.      Take 5 units of your insulin the night before your procedure.       DO NOT take any medications for diabetes the morning of your procedure.    Take these medicines the morning of surgery with A SIP OF WATER         amlodipine, carvedilol, proscar, claritin, rapaflo.     Do not wear jewelry, make-up or nail polish.  Do not wear lotions, powders, or perfumes, or deodorant.  Do not shave 48 hours prior to surgery.  Men may shave face and neck.  Do not bring valuables to the hospital.  [facility] is not responsible for any belongings or valuables.  Contacts, dentures or bridgework may not be worn into surgery.  Leave your suitcase in the car.  After surgery it may be brought to your room.  For patients admitted to the hospital, discharge time will be determined by your treatment team.  Patients discharged the day of surgery will not be allowed to drive home and must have someone with them for 24 hours.    Special instructions:   DO NOT smoke tobacco or vape for 24 hours before your procedure.  Please read over the following fact sheets that you were given. Anesthesia Post-op Instructions and Care and Recovery After Surgery      Colonoscopy, Adult, Care After The following information offers guidance on how to care for yourself after your procedure. Your health care  provider may also give you more specific instructions. If you have problems or questions, contact your health care provider. What can I expect after the procedure? After the procedure, it is common to have: A small amount of blood in your stool for 24 hours after the procedure. Some gas. Mild cramping or bloating of your abdomen. Follow these instructions at home: Eating and drinking  Drink enough fluid to keep your urine pale yellow. Follow instructions from your health care provider about eating or drinking restrictions. Resume your normal diet as told by your health care provider. Avoid heavy or fried foods that are hard to digest. Activity Rest as told by your health care provider. Avoid sitting for a long time without moving. Get up to take short walks every 1-2 hours. This is important to improve blood flow and breathing. Ask for help if you feel weak or unsteady. Return to your normal activities as told by your health care provider. Ask your health care provider what activities are safe for you. Managing cramping and bloating  Try walking around when you have cramps or feel bloated. If directed, apply heat to your abdomen as told by your health care provider. Use the heat source that your health care provider recommends, such as a moist heat  pack or a heating pad. Place a towel between your skin and the heat source. Leave the heat on for 20-30 minutes. Remove the heat if your skin turns bright red. This is especially important if you are unable to feel pain, heat, or cold. You have a greater risk of getting burned. General instructions If you were given a sedative during the procedure, it can affect you for several hours. Do not drive or operate machinery until your health care provider says that it is safe. For the first 24 hours after the procedure: Do not sign important documents. Do not drink alcohol. Do your regular daily activities at a slower pace than normal. Eat soft  foods that are easy to digest. Take over-the-counter and prescription medicines only as told by your health care provider. Keep all follow-up visits. This is important. Contact a health care provider if: You have blood in your stool 2-3 days after the procedure. Get help right away if: You have more than a small spotting of blood in your stool. You have large blood clots in your stool. You have swelling of your abdomen. You have nausea or vomiting. You have a fever. You have increasing pain in your abdomen that is not relieved with medicine. These symptoms may be an emergency. Get help right away. Call 911. Do not wait to see if the symptoms will go away. Do not drive yourself to the hospital. Summary After the procedure, it is common to have a small amount of blood in your stool. You may also have mild cramping and bloating of your abdomen. If you were given a sedative during the procedure, it can affect you for several hours. Do not drive or operate machinery until your health care provider says that it is safe. Get help right away if you have a lot of blood in your stool, nausea or vomiting, a fever, or increased pain in your abdomen. This information is not intended to replace advice given to you by your health care provider. Make sure you discuss any questions you have with your health care provider. Document Revised: 11/22/2020 Document Reviewed: 11/22/2020 Elsevier Patient Education  [place] After The following information offers guidance on how to care for yourself after your procedure. Your health care provider may also give you more specific instructions. If you have problems or questions, contact your health care provider. What can I expect after the procedure? After the procedure, it is common to have: Tiredness. Little or no memory about what happened during or after the procedure. Impaired judgment when it comes to making  decisions. Nausea or vomiting. Some trouble with balance. Follow these instructions at home: For the time period you were told by your health care provider:  Rest. Do not participate in activities where you could fall or become injured. Do not drive or use machinery. Do not drink alcohol. Do not take sleeping pills or medicines that cause drowsiness. Do not make important decisions or sign legal documents. Do not take care of children on your own. Medicines Take over-the-counter and prescription medicines only as told by your health care provider. If you were prescribed antibiotics, take them as told by your health care provider. Do not stop using the antibiotic even if you start to feel better. Eating and drinking Follow instructions from your health care provider about what you may eat and drink. Drink enough fluid to keep your urine pale yellow. If you vomit: Drink clear fluids slowly and  in small amounts as you are able. Clear fluids include water, ice chips, low-calorie sports drinks, and fruit juice that has water added to it (diluted fruit juice). Eat light and bland foods in small amounts as you are able. These foods include bananas, applesauce, rice, lean meats, toast, and crackers. General instructions  Have a responsible adult stay with you for the time you are told. It is important to have someone help care for you until you are awake and alert. If you have sleep apnea, surgery and some medicines can increase your risk for breathing problems. Follow instructions from your health care provider about wearing your sleep device: When you are sleeping. This includes during daytime naps. While taking prescription pain medicines, sleeping medicines, or medicines that make you drowsy. Do not use any products that contain nicotine or tobacco. These products include cigarettes, chewing tobacco, and vaping devices, such as e-cigarettes. If you need help quitting, ask your health care  provider. Contact a health care provider if: You feel nauseous or vomit every time you eat or drink. You feel light-headed. You are still sleepy or having trouble with balance after 24 hours. You get a rash. You have a fever. You have redness or swelling around the IV site. Get help right away if: You have trouble breathing. You have new confusion after you get home. These symptoms may be an emergency. Get help right away. Call 911. Do not wait to see if the symptoms will go away. Do not drive yourself to the hospital. This information is not intended to replace advice given to you by your health care provider. Make sure you discuss any questions you have with your health care provider. Document Revised: 08/27/2021 Document Reviewed: 08/27/2021 Elsevier Patient Education  [place].

## 2022-04-23 ENCOUNTER — Encounter (HOSPITAL_COMMUNITY): Payer: Self-pay

## 2022-04-23 ENCOUNTER — Encounter (HOSPITAL_COMMUNITY)
Admission: RE | Admit: 2022-04-23 | Discharge: 2022-04-23 | Disposition: A | Discharge status: Home / Self Care | Payer: Non-veteran care | Source: Ambulatory Visit | Attending: Internal Medicine | Admitting: Internal Medicine

## 2022-04-23 ENCOUNTER — Telehealth: Payer: Self-pay | Admitting: *Deleted

## 2022-04-23 DIAGNOSIS — N1831 Chronic kidney disease, stage 3a: Secondary | ICD-10-CM

## 2022-04-23 NOTE — Telephone Encounter (Signed)
Someone left vm stating that pt needed to cancel his procedure.Marland Kitchen  [place]

## 2022-04-25 ENCOUNTER — Ambulatory Visit (HOSPITAL_COMMUNITY): Admission: RE | Admit: 2022-04-25 | Payer: Non-veteran care | Source: Ambulatory Visit | Admitting: Internal Medicine

## 2022-04-25 ENCOUNTER — Encounter (HOSPITAL_COMMUNITY): Admission: RE | Payer: Self-pay | Source: Ambulatory Visit

## 2022-04-25 SURGERY — COLONOSCOPY WITH PROPOFOL
Anesthesia: Monitor Anesthesia Care

## 2022-05-01 ENCOUNTER — Encounter: Payer: Non-veteran care | Admitting: Internal Medicine

## 2022-05-01 NOTE — Progress Notes (Signed)
Erroneous encounter - please disregard.

## 2022-05-02 ENCOUNTER — Encounter: Payer: Self-pay | Admitting: Internal Medicine

## 2022-05-21 DIAGNOSIS — J449 Chronic obstructive pulmonary disease, unspecified: Secondary | ICD-10-CM | POA: Diagnosis not present

## 2022-05-21 DIAGNOSIS — I10 Essential (primary) hypertension: Secondary | ICD-10-CM | POA: Diagnosis not present

## 2022-06-19 DIAGNOSIS — J449 Chronic obstructive pulmonary disease, unspecified: Secondary | ICD-10-CM | POA: Diagnosis not present

## 2022-06-19 DIAGNOSIS — I10 Essential (primary) hypertension: Secondary | ICD-10-CM | POA: Diagnosis not present

## 2022-06-24 ENCOUNTER — Ambulatory Visit (INDEPENDENT_AMBULATORY_CARE_PROVIDER_SITE_OTHER): Payer: Medicare HMO | Admitting: Urology

## 2022-06-24 VITALS — BP 176/78 | HR 86

## 2022-06-24 DIAGNOSIS — R350 Frequency of micturition: Secondary | ICD-10-CM | POA: Diagnosis not present

## 2022-06-24 DIAGNOSIS — R3912 Poor urinary stream: Secondary | ICD-10-CM | POA: Diagnosis not present

## 2022-06-24 DIAGNOSIS — I509 Heart failure, unspecified: Secondary | ICD-10-CM | POA: Diagnosis not present

## 2022-06-24 DIAGNOSIS — N400 Enlarged prostate without lower urinary tract symptoms: Secondary | ICD-10-CM

## 2022-06-24 DIAGNOSIS — R351 Nocturia: Secondary | ICD-10-CM | POA: Diagnosis not present

## 2022-06-24 LAB — BLADDER SCAN AMB NON-IMAGING: Scan Result: 82

## 2022-06-24 MED ORDER — SILODOSIN 8 MG PO CAPS: 8.0000 mg | ORAL_CAPSULE | Freq: Every day | ORAL | 11 refills | Status: AC

## 2022-06-24 MED ORDER — GEMTESA 75 MG PO TABS: 75.0000 mg | ORAL_TABLET | Freq: Every day | ORAL | 11 refills | Status: DC

## 2022-06-24 MED ORDER — FINASTERIDE 5 MG PO TABS: 5.0000 mg | ORAL_TABLET | Freq: Every day | ORAL | 3 refills | Status: AC

## 2022-06-24 NOTE — Progress Notes (Unsigned)
post void residual=82 

## 2022-06-24 NOTE — Patient Instructions (Signed)

## 2022-06-24 NOTE — Progress Notes (Unsigned)
06/24/2022 2:57 PM   NABEEL KUCHER [birth year] [id number]  Referring provider: [facility] [place] [address]  Followup urinary frequency and nocturia   HPI: Mr Craig Black is a 77yo here for followup for BPH with nocturia and urinary frequency. Gemtesa improved nocturia to 2-3x. Urine stream strong. He ran out of [place] and his LUTS have worsened. IPSS 29 QOL 4. He remains on rapaflo '8mg'$  and finasteride '5mg'$  . No straining to urinate. No hematuria and dysuria.    PMH: Past Medical History:  Diagnosis Date   Arthritis    Chronic kidney disease    Chronic sinusitis    COPD (chronic obstructive pulmonary disease) (HCC)    Diabetes mellitus    Gout    Hard of hearing    Right ear   Hyperchloremia    Hypertension    Murmur    Neuropathy in diabetes ([place])    Obesity    OSA (obstructive sleep apnea)    Paroxysmal A-fib ([place])    Tobacco abuse     Surgical History: Past Surgical History:  Procedure Laterality Date   APPENDECTOMY     cataract     right lens replacement   EXTRACORPOREAL SHOCK WAVE LITHOTRIPSY Left 12/21/2019   Procedure: EXTRACORPOREAL SHOCK WAVE LITHOTRIPSY (ESWL);  Surgeon: Cleon Gustin, MD;  Location: [facility];  Service: Urology;  Laterality: Left;   HERNIA REPAIR     SINUS EXPLORATION     1998    Home Medications:  Allergies as of 06/24/2022   No Known Allergies      Medication List        Accurate as of June 24, 2022  2:57 PM. If you have any questions, ask your nurse or doctor.          acetaminophen 325 MG tablet Commonly known as: TYLENOL Take 650 mg by mouth every 6 (six) hours as needed for moderate pain.   albuterol 108 (90 Base) MCG/ACT inhaler Commonly known as: VENTOLIN HFA Inhale 2 puffs into the lungs every 6 (six) hours as needed for wheezing or shortness of breath.   amLODipine 10 MG tablet Commonly known as: NORVASC Take 10 mg by mouth daily.   apixaban 5 MG Tabs tablet Commonly known  as: ELIQUIS Take 5 mg by mouth 2 (two) times daily.   BLUE-EMU HEMP EX Apply 1 Application topically daily as needed (pain).   calcitRIOL 0.25 MCG capsule Commonly known as: ROCALTROL Take 0.25 mcg by mouth every Monday, Wednesday, and Friday.   carvedilol 6.25 MG tablet Commonly known as: COREG Take 6.25 mg by mouth 2 (two) times daily.   finasteride 5 MG tablet Commonly known as: PROSCAR Take 1 tablet (5 mg total) by mouth daily.   furosemide 20 MG tablet Commonly known as: LASIX Take 20 mg by mouth 2 (two) times daily.   Gemtesa 75 MG Tabs Generic drug: Vibegron Take 75 mg by mouth daily.   hydrochlorothiazide 25 MG tablet Commonly known as: HYDRODIURIL Take 25 mg by mouth daily.   insulin aspart protamine- aspart (70-30) 100 UNIT/ML injection Commonly known as: NOVOLOG MIX 70/30 Inject 0.25 mLs (25 Units total) into the skin 2 (two) times daily with a meal. What changed: how much to take   loratadine 10 MG tablet Commonly known as: CLARITIN Take 10 mg by mouth daily as needed for allergies.   NOVOLOG IJ Inject 10 Units as directed in the morning and at bedtime.   OXYGEN  Inhale 2 L into the lungs continuous.   rosuvastatin 40 MG tablet Commonly known as: CRESTOR Take 40 mg by mouth at bedtime.   silodosin 8 MG Caps capsule Commonly known as: RAPAFLO Take 1 capsule (8 mg total) by mouth daily with breakfast.        Allergies: No Known Allergies  Family History: Family History  Adopted: Yes  Problem Relation Age of Onset   Colon cancer Neg Hx    Colon polyps Neg Hx     Social History:  reports that he has been smoking cigars. He has been exposed to tobacco smoke. He has never used smokeless tobacco. He reports that he does not currently use alcohol. He reports that he does not use drugs.  ROS: All other review of systems were reviewed and are negative except what is noted above in HPI  Physical Exam: BP (!) 176/78   Pulse 86    Constitutional:  Alert and oriented, No acute distress. HEENT: [place] AT, moist mucus membranes.  Trachea midline, no masses. Cardiovascular: No clubbing, cyanosis, or edema. Respiratory: Normal respiratory effort, no increased work of breathing. GI: Abdomen is soft, nontender, nondistended, no abdominal masses GU: No CVA tenderness.  Lymph: No cervical or inguinal lymphadenopathy. Skin: No rashes, bruises or suspicious lesions. Neurologic: Grossly intact, no focal deficits, moving all 4 extremities. Psychiatric: Normal mood and affect.  Laboratory Data: Lab Results  Component Value Date   WBC 7.8 08/22/2021   HGB 15.1 08/22/2021   HCT 45.9 08/22/2021   MCV 93.3 08/22/2021   PLT 238 08/22/2021    Lab Results  Component Value Date   CREATININE 1.98 (H) 08/22/2021    No results found for: "PSA"  No results found for: "TESTOSTERONE"  Lab Results  Component Value Date   HGBA1C 6.9 (H) 03/12/2021    Urinalysis    Component Value Date/Time   COLORURINE YELLOW 08/22/2021 1615   APPEARANCEUR Clear 01/08/2022 1608   LABSPEC 1.015 08/22/2021 1615   PHURINE 5.0 08/22/2021 1615   GLUCOSEU 2+ (A) 01/08/2022 1608   HGBUR NEGATIVE 08/22/2021 1615   BILIRUBINUR Negative 01/08/2022 1608   KETONESUR NEGATIVE 08/22/2021 1615   PROTEINUR 3+ (A) 01/08/2022 1608   PROTEINUR 100 (A) 08/22/2021 1615   NITRITE Negative 01/08/2022 1608   NITRITE NEGATIVE 08/22/2021 1615   LEUKOCYTESUR Negative 01/08/2022 1608   LEUKOCYTESUR NEGATIVE 08/22/2021 1615    Lab Results  Component Value Date   LABMICR See below: 01/08/2022   WBCUA 0-5 01/08/2022   LABEPIT 0-10 01/08/2022   MUCUS Present 10/03/2021   BACTERIA None seen 01/08/2022    Pertinent Imaging:  Results for orders placed during the hospital encounter of 12/21/19  DG Abd 1 View  Narrative CLINICAL DATA:  Preop evaluation for upcoming lithotripsy  EXAM: ABDOMEN - 1 VIEW  COMPARISON:  12/08/2019  FINDINGS: Scattered  large and small bowel gas is noted. Known left renal stone is again identified and stable. Vascular calcifications are noted in the kidneys bilaterally. No free air is noted. Degenerative changes of lumbar spine are seen.  IMPRESSION: Stable small left renal stone.   Electronically Signed By: Inez Catalina M.D. On: 12/21/2019 08:31  No results found for this or any previous visit.  No results found for this or any previous visit.  No results found for this or any previous visit.  Results for orders placed during the hospital encounter of 04/19/21  US RENAL  Narrative CLINICAL DATA:  Acute renal failure with tubular necrosis.  Status post left lithotripsy 12/2019.  EXAM: RENAL / URINARY TRACT ULTRASOUND COMPLETE  COMPARISON:  Renal ultrasound 11/29/2019  FINDINGS: Right Kidney:  Renal measurements: 13.3 x 6.6 x 6.4 = volume: 296 mL. Echogenicity within normal limits. No mass or hydronephrosis visualized.  Left Kidney:  Renal measurements: 13.8 x 4.9 x 6.0 = volume: 208 mL. Echogenicity within normal limits. No mass or hydronephrosis visualized.  Bladder:  Possible minimal layering echogenic debris within the prevoid urinary bladder. Prevoid urinary bladder volume: 225 mL. Postvoid residual: 41 mL.  Other:  None.  IMPRESSION: 1. Normal appearance of the bilateral kidneys. 2. Mildly increased postvoid urinary bladder volume residual. Possible small amount of debris within the prevoid urinary bladder.   Electronically Signed By: Yvonne Kendall On: 04/19/2021 17:19  No valid procedures specified. No results found for this or any previous visit.  No results found for this or any previous visit.   Assessment & Plan:    1. Benign prostatic hyperplasia, unspecified whether lower urinary tract symptoms present -Continue rapalfo '8mg'$  and finasteride '5mg'$  - Urinalysis, Routine w reflex microscopic - BLADDER SCAN AMB NON-IMAGING  2. Urinary  frequency -Gemtesa '75mg'$  daily - Urinalysis, Routine w reflex microscopic - BLADDER SCAN AMB NON-IMAGING  3. Weak urinary stream -continue finasteride '5mg'$  and rapalfo '8mg'$    No follow-ups on file.  Nicolette Bang, MD  [facility] [place]

## 2022-06-25 ENCOUNTER — Encounter: Payer: Self-pay | Admitting: Urology

## 2022-07-20 DIAGNOSIS — J449 Chronic obstructive pulmonary disease, unspecified: Secondary | ICD-10-CM | POA: Diagnosis not present

## 2022-07-20 DIAGNOSIS — I10 Essential (primary) hypertension: Secondary | ICD-10-CM | POA: Diagnosis not present

## 2022-08-14 DIAGNOSIS — I4821 Permanent atrial fibrillation: Secondary | ICD-10-CM | POA: Diagnosis not present

## 2022-08-14 DIAGNOSIS — J449 Chronic obstructive pulmonary disease, unspecified: Secondary | ICD-10-CM | POA: Diagnosis not present

## 2022-08-14 DIAGNOSIS — F17290 Nicotine dependence, other tobacco product, uncomplicated: Secondary | ICD-10-CM | POA: Diagnosis not present

## 2022-08-14 DIAGNOSIS — I10 Essential (primary) hypertension: Secondary | ICD-10-CM | POA: Diagnosis not present

## 2022-08-14 DIAGNOSIS — F17210 Nicotine dependence, cigarettes, uncomplicated: Secondary | ICD-10-CM | POA: Diagnosis not present

## 2022-08-14 DIAGNOSIS — E1142 Type 2 diabetes mellitus with diabetic polyneuropathy: Secondary | ICD-10-CM | POA: Diagnosis not present

## 2022-08-26 ENCOUNTER — Telehealth: Payer: Self-pay

## 2022-08-26 NOTE — Telephone Encounter (Signed)
Patient called office and left voicemail needing to schedule office visit. Returned call to patient. No answer. Left message to return call.

## 2022-09-03 ENCOUNTER — Telehealth: Payer: Self-pay

## 2022-09-03 NOTE — Telephone Encounter (Signed)
Marius Ditch called advising that the patient has been taking Vibegron (GEMTESA) 75 MG TABS since his last appointment in March. She advised he is not seeing any improvement and wanted to know if there was another medication that could be prescribed and sent to the pharmacy below.    Pharmacy: [id number] - [place], [place] - [address]    Thank you

## 2022-09-03 NOTE — Telephone Encounter (Signed)
Please see below.

## 2022-09-04 ENCOUNTER — Ambulatory Visit: Payer: Non-veteran care | Admitting: Gastroenterology

## 2022-09-05 DIAGNOSIS — E211 Secondary hyperparathyroidism, not elsewhere classified: Secondary | ICD-10-CM | POA: Diagnosis not present

## 2022-09-05 DIAGNOSIS — R809 Proteinuria, unspecified: Secondary | ICD-10-CM | POA: Diagnosis not present

## 2022-09-05 DIAGNOSIS — E1122 Type 2 diabetes mellitus with diabetic chronic kidney disease: Secondary | ICD-10-CM | POA: Diagnosis not present

## 2022-09-05 DIAGNOSIS — N189 Chronic kidney disease, unspecified: Secondary | ICD-10-CM | POA: Diagnosis not present

## 2022-09-05 DIAGNOSIS — I129 Hypertensive chronic kidney disease with stage 1 through stage 4 chronic kidney disease, or unspecified chronic kidney disease: Secondary | ICD-10-CM | POA: Diagnosis not present

## 2022-09-05 DIAGNOSIS — D638 Anemia in other chronic diseases classified elsewhere: Secondary | ICD-10-CM | POA: Diagnosis not present

## 2022-09-05 DIAGNOSIS — Z79899 Other long term (current) drug therapy: Secondary | ICD-10-CM | POA: Diagnosis not present

## 2022-09-05 MED ORDER — TROSPIUM CHLORIDE 20 MG PO TABS: 20.0000 mg | ORAL_TABLET | Freq: Two times a day (BID) | ORAL | 11 refills | Status: AC

## 2022-09-05 NOTE — Telephone Encounter (Addendum)
Rx sent to pharmacy and patient notified via voicemail.

## 2022-09-09 DIAGNOSIS — N200 Calculus of kidney: Secondary | ICD-10-CM | POA: Diagnosis not present

## 2022-09-09 DIAGNOSIS — E1129 Type 2 diabetes mellitus with other diabetic kidney complication: Secondary | ICD-10-CM | POA: Diagnosis not present

## 2022-09-09 DIAGNOSIS — R809 Proteinuria, unspecified: Secondary | ICD-10-CM | POA: Diagnosis not present

## 2022-09-09 DIAGNOSIS — R82991 Hypocitraturia: Secondary | ICD-10-CM | POA: Diagnosis not present

## 2022-09-14 DIAGNOSIS — J449 Chronic obstructive pulmonary disease, unspecified: Secondary | ICD-10-CM | POA: Diagnosis not present

## 2022-09-14 DIAGNOSIS — I10 Essential (primary) hypertension: Secondary | ICD-10-CM | POA: Diagnosis not present

## 2022-09-25 ENCOUNTER — Encounter: Payer: Self-pay | Admitting: Urology

## 2022-09-25 ENCOUNTER — Ambulatory Visit: Payer: Medicare HMO | Admitting: Urology

## 2022-09-25 VITALS — BP 141/81 | HR 80

## 2022-09-25 DIAGNOSIS — N400 Enlarged prostate without lower urinary tract symptoms: Secondary | ICD-10-CM

## 2022-09-25 DIAGNOSIS — R350 Frequency of micturition: Secondary | ICD-10-CM

## 2022-09-25 DIAGNOSIS — R351 Nocturia: Secondary | ICD-10-CM | POA: Diagnosis not present

## 2022-09-25 LAB — BLADDER SCAN AMB NON-IMAGING: Scan Result: 127

## 2022-09-25 NOTE — Progress Notes (Signed)
09/25/2022 3:54 PM   YOAN MASSETT 1946-01-17 [id number]  Referring provider: [facility] [address] [place],  [place] [address]  Followup OAB and BPH   HPI: Mr Craig Black is a 77yo here for followup for OAb and BPH. He is on rapaflo 8mg . IPSS 12 QOL 3 on rapaflo 8mg  and sanctura 20mg  BID. He continues to have urinary urgency every 2 hours and he has urinary frequency every 30-45 minutes. Urine stream is intermittently weak. He denies straining to urinate. Nocturia 4-7x.    PMH: Past Medical History:  Diagnosis Date   Arthritis    Chronic kidney disease    Chronic sinusitis    COPD (chronic obstructive pulmonary disease) (HCC)    Diabetes mellitus    Gout    Hard of hearing    Right ear   Hyperchloremia    Hypertension    Murmur    Neuropathy in diabetes (HCC)    Obesity    OSA (obstructive sleep apnea)    Paroxysmal A-fib (HCC)    Tobacco abuse     Surgical History: Past Surgical History:  Procedure Laterality Date   APPENDECTOMY     cataract     right lens replacement   EXTRACORPOREAL SHOCK WAVE LITHOTRIPSY Left 12/21/2019   Procedure: EXTRACORPOREAL SHOCK WAVE LITHOTRIPSY (ESWL);  Surgeon: Malen Gauze, MD;  Location: [facility];  Service: Urology;  Laterality: Left;   HERNIA REPAIR     SINUS EXPLORATION     1998    Home Medications:  Allergies as of 09/25/2022   No Known Allergies      Medication List        Accurate as of September 25, 2022  3:54 PM. If you have any questions, ask your nurse or doctor.          acetaminophen 325 MG tablet Commonly known as: TYLENOL Take 650 mg by mouth every 6 (six) hours as needed for moderate pain.   albuterol 108 (90 Base) MCG/ACT inhaler Commonly known as: VENTOLIN HFA Inhale 2 puffs into the lungs every 6 (six) hours as needed for wheezing or shortness of breath.   amLODipine 10 MG tablet Commonly known as: NORVASC Take 10 mg by mouth daily.   apixaban 5 MG Tabs tablet Commonly known as:  ELIQUIS Take 5 mg by mouth 2 (two) times daily.   BLUE-EMU HEMP EX Apply 1 Application topically daily as needed (pain).   calcitRIOL 0.25 MCG capsule Commonly known as: ROCALTROL Take 0.25 mcg by mouth every Monday, Wednesday, and Friday.   carvedilol 6.25 MG tablet Commonly known as: COREG Take 6.25 mg by mouth 2 (two) times daily.   finasteride 5 MG tablet Commonly known as: PROSCAR Take 1 tablet (5 mg total) by mouth daily.   furosemide 20 MG tablet Commonly known as: LASIX Take 20 mg by mouth 2 (two) times daily.   hydrochlorothiazide 25 MG tablet Commonly known as: HYDRODIURIL Take 25 mg by mouth daily.   insulin aspart protamine- aspart (70-30) 100 UNIT/ML injection Commonly known as: NOVOLOG MIX 70/30 Inject 0.25 mLs (25 Units total) into the skin 2 (two) times daily with a meal. What changed: how much to take   loratadine 10 MG tablet Commonly known as: CLARITIN Take 10 mg by mouth daily as needed for allergies.   NOVOLOG IJ Inject 10 Units as directed in the morning and at bedtime.   OXYGEN Inhale 2 L into the lungs continuous.   rosuvastatin 40 MG tablet  Commonly known as: CRESTOR Take 40 mg by mouth at bedtime.   silodosin 8 MG Caps capsule Commonly known as: RAPAFLO Take 1 capsule (8 mg total) by mouth daily with breakfast.   trospium 20 MG tablet Commonly known as: SANCTURA Take 1 tablet (20 mg total) by mouth 2 (two) times daily.        Allergies: No Known Allergies  Family History: Family History  Adopted: Yes  Problem Relation Age of Onset   Colon cancer Neg Hx    Colon polyps Neg Hx     Social History:  reports that he has been smoking cigars. He has been exposed to tobacco smoke. He has never used smokeless tobacco. He reports that he does not currently use alcohol. He reports that he does not use drugs.  ROS: All other review of systems were reviewed and are negative except what is noted above in HPI  Physical Exam: BP (!)  141/81   Pulse 80   Constitutional:  Alert and oriented, No acute distress. HEENT: [place] AT, moist mucus membranes.  Trachea midline, no masses. Cardiovascular: No clubbing, cyanosis, or edema. Respiratory: Normal respiratory effort, no increased work of breathing. GI: Abdomen is soft, nontender, nondistended, no abdominal masses GU: No CVA tenderness.  Lymph: No cervical or inguinal lymphadenopathy. Skin: No rashes, bruises or suspicious lesions. Neurologic: Grossly intact, no focal deficits, moving all 4 extremities. Psychiatric: Normal mood and affect.  Laboratory Data: Lab Results  Component Value Date   WBC 7.8 08/22/2021   HGB 15.1 08/22/2021   HCT 45.9 08/22/2021   MCV 93.3 08/22/2021   PLT 238 08/22/2021    Lab Results  Component Value Date   CREATININE 1.98 (H) 08/22/2021    No results found for: "PSA"  No results found for: "TESTOSTERONE"  Lab Results  Component Value Date   HGBA1C 6.9 (H) 03/12/2021    Urinalysis    Component Value Date/Time   COLORURINE YELLOW 08/22/2021 1615   APPEARANCEUR Clear 01/08/2022 1608   LABSPEC 1.015 08/22/2021 1615   PHURINE 5.0 08/22/2021 1615   GLUCOSEU 2+ (A) 01/08/2022 1608   HGBUR NEGATIVE 08/22/2021 1615   BILIRUBINUR Negative 01/08/2022 1608   KETONESUR NEGATIVE 08/22/2021 1615   PROTEINUR 3+ (A) 01/08/2022 1608   PROTEINUR 100 (A) 08/22/2021 1615   NITRITE Negative 01/08/2022 1608   NITRITE NEGATIVE 08/22/2021 1615   LEUKOCYTESUR Negative 01/08/2022 1608   LEUKOCYTESUR NEGATIVE 08/22/2021 1615    Lab Results  Component Value Date   LABMICR See below: 01/08/2022   WBCUA 0-5 01/08/2022   LABEPIT 0-10 01/08/2022   MUCUS Present 10/03/2021   BACTERIA None seen 01/08/2022    Pertinent Imaging:  Results for orders placed during the hospital encounter of 12/21/19  DG Abd 1 View  Narrative CLINICAL DATA:  Preop evaluation for upcoming lithotripsy  EXAM: ABDOMEN - 1 VIEW  COMPARISON:   12/08/2019  FINDINGS: Scattered large and small bowel gas is noted. Known left renal stone is again identified and stable. Vascular calcifications are noted in the kidneys bilaterally. No free air is noted. Degenerative changes of lumbar spine are seen.  IMPRESSION: Stable small left renal stone.   Electronically Signed By: Alcide Clever M.D. On: 12/21/2019 08:31  No results found for this or any previous visit.  No results found for this or any previous visit.  No results found for this or any previous visit.  Results for orders placed during the hospital encounter of 04/19/21  US RENAL  Narrative  CLINICAL DATA:  Acute renal failure with tubular necrosis. Status post left lithotripsy 12/2019.  EXAM: RENAL / URINARY TRACT ULTRASOUND COMPLETE  COMPARISON:  Renal ultrasound 11/29/2019  FINDINGS: Right Kidney:  Renal measurements: 13.3 x 6.6 x 6.4 = volume: 296 mL. Echogenicity within normal limits. No mass or hydronephrosis visualized.  Left Kidney:  Renal measurements: 13.8 x 4.9 x 6.0 = volume: 208 mL. Echogenicity within normal limits. No mass or hydronephrosis visualized.  Bladder:  Possible minimal layering echogenic debris within the prevoid urinary bladder. Prevoid urinary bladder volume: 225 mL. Postvoid residual: 41 mL.  Other:  None.  IMPRESSION: 1. Normal appearance of the bilateral kidneys. 2. Mildly increased postvoid urinary bladder volume residual. Possible small amount of debris within the prevoid urinary bladder.   Electronically Signed By: Neita Garnet On: 04/19/2021 17:19  No valid procedures specified. No results found for this or any previous visit.  No results found for this or any previous visit.   Assessment & Plan:    1. Benign prostatic hyperplasia, unspecified whether lower urinary tract symptoms present -We will schedule for Urodynamics - Urinalysis, Routine w reflex microscopic - BLADDER SCAN AMB NON-IMAGING  2.  Urinary frequency -Schedule for Urodynamics  3. Nocturia Continue rapaflo and sanctura   No follow-ups on file.  Wilkie Aye, MD  [facility] [place]

## 2022-09-26 LAB — MICROSCOPIC EXAMINATION: Bacteria, UA: NONE SEEN

## 2022-09-26 LAB — URINALYSIS, ROUTINE W REFLEX MICROSCOPIC
Bilirubin, UA: NEGATIVE
Ketones, UA: NEGATIVE
Leukocytes,UA: NEGATIVE
Nitrite, UA: NEGATIVE
RBC, UA: NEGATIVE
Specific Gravity, UA: 1.03 (ref 1.005–1.030)
Urobilinogen, Ur: 0.2 mg/dL (ref 0.2–1.0)
pH, UA: 5.5 (ref 5.0–7.5)

## 2022-10-01 ENCOUNTER — Encounter: Payer: Self-pay | Admitting: Urology

## 2022-10-01 NOTE — Patient Instructions (Signed)

## 2022-10-08 ENCOUNTER — Other Ambulatory Visit: Payer: Self-pay | Admitting: *Deleted

## 2022-10-08 ENCOUNTER — Ambulatory Visit (INDEPENDENT_AMBULATORY_CARE_PROVIDER_SITE_OTHER): Payer: Medicare HMO | Admitting: Gastroenterology

## 2022-10-08 ENCOUNTER — Encounter: Payer: Self-pay | Admitting: Gastroenterology

## 2022-10-08 VITALS — BP 129/80 | HR 55 | Temp 97.8°F | Ht 68.0 in | Wt 282.4 lb

## 2022-10-08 DIAGNOSIS — J449 Chronic obstructive pulmonary disease, unspecified: Secondary | ICD-10-CM

## 2022-10-08 DIAGNOSIS — Z86010 Personal history of colonic polyps: Secondary | ICD-10-CM

## 2022-10-08 DIAGNOSIS — I5033 Acute on chronic diastolic (congestive) heart failure: Secondary | ICD-10-CM

## 2022-10-08 DIAGNOSIS — K219 Gastro-esophageal reflux disease without esophagitis: Secondary | ICD-10-CM

## 2022-10-08 MED ORDER — PANTOPRAZOLE SODIUM 40 MG PO TBEC: 40.0000 mg | DELAYED_RELEASE_TABLET | Freq: Every day | ORAL | 3 refills | Status: AC

## 2022-10-08 NOTE — Patient Instructions (Signed)
I would like for you to see cardiology first for routine visit and make sure you are stable for a colonoscopy and upper endoscopy.  For reflux: start taking pantoprazole once a day, 30 minutes before breakfast.   For constipation: any day you do not have a bowel movement, take 1 capful of Miralax with 8 ounces of water.   We will arrange a colonoscopy and upper endoscopy once we get clearance from Cardiology!  You will need to stop Eliquis 2 days before. You will need to take only 1/2 dose of your evening insulin the night before and no insulin the day of the procedure.  I enjoyed seeing you again today! I value our relationship and want to provide genuine, compassionate, and quality care. You may receive a survey regarding your visit with me, and I welcome your feedback! Thanks so much for taking the time to complete this. I look forward to seeing you again.      Gelene Mink, PhD, ANP-BC [facility]

## 2022-10-08 NOTE — Progress Notes (Signed)
Gastroenterology Office Note     Primary Care Physician:  [facility]  Primary Gastroenterologist: Dr. Jena Gauss    Chief Complaint   Chief Complaint  Patient presents with   Follow-up    Follow up to schedule colonoscopy. Acid reflux     History of Present Illness   Craig Black is a 77 y.o. male presenting today to arrange surveillance colonoscopy. History of multiple polyps in 2019 but procedure notes not available (outside facility). He also has a history of chronic GERD. He was initially seen in Nov 2023 to arrange surveillance colonoscopy, but this was cancelled due to sickness.   Dealing with constipation. Takes philips milk of magnesia as needed. He had several small balls of stool then dark stool. Used to have a BM about twice a day.   Significant GERD. No PPI. I had sent this in but he didn't start taking. Burping. No dysphagia. He states he had 18 polyps many years ago.   He is on oxygen 3 liters nasal cannula. Notes DOE. No chest pain. Overdue for cardiology evaluation. +smoking. States he doesn't inhale, just smokes and blows it out.   Will use a walker at times. Wheelchair if long distances.   Marius Ditch, significant other, present today.         Past Medical History:  Diagnosis Date   Arthritis    Chronic kidney disease    Chronic sinusitis    COPD (chronic obstructive pulmonary disease) (HCC)    Diabetes mellitus    Gout    Hard of hearing    Right ear   Hyperchloremia    Hypertension    Murmur    Neuropathy in diabetes (HCC)    Obesity    OSA (obstructive sleep apnea)    Paroxysmal A-fib (HCC)    Tobacco abuse     Past Surgical History:  Procedure Laterality Date   APPENDECTOMY     cataract     right lens replacement   EXTRACORPOREAL SHOCK WAVE LITHOTRIPSY Left 12/21/2019   Procedure: EXTRACORPOREAL SHOCK WAVE LITHOTRIPSY (ESWL);  Surgeon: Malen Gauze, MD;  Location: [facility];  Service: Urology;  Laterality: Left;    HERNIA REPAIR     SINUS EXPLORATION     1998    Current Outpatient Medications  Medication Sig Dispense Refill   acetaminophen (TYLENOL) 325 MG tablet Take 650 mg by mouth every 6 (six) hours as needed for moderate pain.     albuterol (VENTOLIN HFA) 108 (90 Base) MCG/ACT inhaler Inhale 2 puffs into the lungs every 6 (six) hours as needed for wheezing or shortness of breath.     amLODipine (NORVASC) 10 MG tablet Take 10 mg by mouth daily.     apixaban (ELIQUIS) 5 MG TABS tablet Take 5 mg by mouth 2 (two) times daily.     calcitRIOL (ROCALTROL) 0.25 MCG capsule Take 0.25 mcg by mouth every Monday, Wednesday, and Friday.     carvedilol (COREG) 6.25 MG tablet Take 6.25 mg by mouth 2 (two) times daily.     finasteride (PROSCAR) 5 MG tablet Take 1 tablet (5 mg total) by mouth daily. 90 tablet 3   furosemide (LASIX) 20 MG tablet Take 20 mg by mouth 2 (two) times daily.     hydrochlorothiazide (HYDRODIURIL) 25 MG tablet Take 25 mg by mouth daily.     Insulin Aspart (NOVOLOG IJ) Inject 10 Units as directed in the morning and at bedtime.     insulin  aspart protamine- aspart (NOVOLOG MIX 70/30) (70-30) 100 UNIT/ML injection Inject 0.25 mLs (25 Units total) into the skin 2 (two) times daily with a meal. (Patient taking differently: Inject 50 Units into the skin 2 (two) times daily with a meal.) 10 mL 0   loratadine (CLARITIN) 10 MG tablet Take 10 mg by mouth daily as needed for allergies.     OXYGEN Inhale 2 L into the lungs continuous.     rosuvastatin (CRESTOR) 40 MG tablet Take 40 mg by mouth at bedtime.     silodosin (RAPAFLO) 8 MG CAPS capsule Take 1 capsule (8 mg total) by mouth daily with breakfast. 30 capsule 11   Trolamine Salicylate (BLUE-EMU HEMP EX) Apply 1 Application topically daily as needed (pain).     trospium (SANCTURA) 20 MG tablet Take 1 tablet (20 mg total) by mouth 2 (two) times daily. 60 tablet 11   No current facility-administered medications for this visit.    Allergies as of  10/08/2022   (No Known Allergies)    Family History  Adopted: Yes  Problem Relation Age of Onset   Colon cancer Neg Hx    Colon polyps Neg Hx     Social History   Socioeconomic History   Marital status: Widowed    Spouse name: Not on file   Number of children: Not on file   Years of education: Not on file   Highest education level: Not on file  Occupational History   Occupation: retired  Tobacco Use   Smoking status: Every Day    Types: Cigars    Passive exposure: Current   Smokeless tobacco: Never   Tobacco comments:    4-5 cigars daily  Vaping Use   Vaping Use: Never used  Substance and Sexual Activity   Alcohol use: Not Currently   Drug use: No   Sexual activity: Not on file  Other Topics Concern   Not on file  Social History Narrative   Not on file   Social Determinants of Health   Financial Resource Strain: Not on file  Food Insecurity: Not on file  Transportation Needs: Not on file  Physical Activity: Not on file  Stress: Not on file  Social Connections: Not on file  Intimate Partner Violence: Not on file     Review of Systems   Gen: Denies any fever, chills, fatigue, weight loss, lack of appetite.  CV: Denies chest pain, heart palpitations, peripheral edema, syncope.  Resp: Denies shortness of breath at rest or with exertion. Denies wheezing or cough.  GI: Denies dysphagia or odynophagia. Denies jaundice, hematemesis, fecal incontinence. GU : Denies urinary burning, urinary frequency, urinary hesitancy MS: Denies joint pain, muscle weakness, cramps, or limitation of movement.  Derm: Denies rash, itching, dry skin Psych: Denies depression, anxiety, memory loss, and confusion Heme: Denies bruising, bleeding, and enlarged lymph nodes.   Physical Exam   Wt 282 lb 6.4 oz (128.1 kg)   BMI 45.58 kg/m  General:   Alert and oriented. Pleasant and cooperative. 3 liters nasal cannula O2.  Head:  Normocephalic and atraumatic. Eyes:  Without  icterus Ears: hard of hearing Lungs: clear bilaterally Cardiac: S1 S2 without murmurs Abdomen:  +BS, obese, difficult to appreciate HSM due to large AP diameter.  Rectal:  Deferred  Msk:  Symmetrical without gross deformities. Normal posture. Extremities:  Without edema. Neurologic:  Alert and  oriented x4;  grossly normal neurologically. Skin:  Intact without significant lesions or rashes. Psych:  Alert and cooperative.  Normal mood and affect.   Assessment   Craig Black is a 77 y.o. male with history of HTN, DM, HF, OSA, COPD, HLD, CKD, afib on Eliquis,presenting today to arrange surveillance colonoscopy. History of multiple polyps in 2019 but procedure notes not available (outside facility). He also has a history of chronic GERD. He was initially seen in Nov 2023 to arrange surveillance colonoscopy, but this was cancelled due to sickness.   Dealing with constipation. Takes philips milk of magnesia as needed. He had several small balls of stool then dark stool. Used to have a BM about twice a day.   Significant GERD. No PPI. I had sent this in but he didn't start taking. Burping. No dysphagia. He states he had 18 polyps many years ago.   He is on oxygen 3 liters nasal cannula. Notes DOE. No chest pain. Overdue for cardiology evaluation. +smoking. States he doesn't inhale, just smokes and blows it out.   Will use a walker at times. Wheelchair if long distances.   Marius Ditch, significant other, present today.       PLAN    Start pantoprazole once daily Miralax once daily Cardiac clearance prior to procedures Proceed with colonoscopy/EGD by Dr. Jena Gauss in near future: the risks, benefits, and alternatives have been discussed with the patient in detail. The patient states understanding and desires to proceed. Eliquis on hold 48 hours prior    Gelene Mink, PhD, [facility]enter[facility]

## 2022-10-14 DIAGNOSIS — J449 Chronic obstructive pulmonary disease, unspecified: Secondary | ICD-10-CM | POA: Diagnosis not present

## 2022-10-14 DIAGNOSIS — I10 Essential (primary) hypertension: Secondary | ICD-10-CM | POA: Diagnosis not present

## 2022-10-29 DIAGNOSIS — E1165 Type 2 diabetes mellitus with hyperglycemia: Secondary | ICD-10-CM | POA: Diagnosis not present

## 2022-10-29 DIAGNOSIS — I10 Essential (primary) hypertension: Secondary | ICD-10-CM | POA: Diagnosis not present

## 2022-10-29 DIAGNOSIS — J449 Chronic obstructive pulmonary disease, unspecified: Secondary | ICD-10-CM | POA: Diagnosis not present

## 2022-10-29 DIAGNOSIS — H6691 Otitis media, unspecified, right ear: Secondary | ICD-10-CM | POA: Diagnosis not present

## 2022-11-04 DIAGNOSIS — N189 Chronic kidney disease, unspecified: Secondary | ICD-10-CM | POA: Diagnosis not present

## 2022-11-04 DIAGNOSIS — E1122 Type 2 diabetes mellitus with diabetic chronic kidney disease: Secondary | ICD-10-CM | POA: Diagnosis not present

## 2022-11-04 DIAGNOSIS — N1832 Chronic kidney disease, stage 3b: Secondary | ICD-10-CM | POA: Diagnosis not present

## 2022-11-04 DIAGNOSIS — G4733 Obstructive sleep apnea (adult) (pediatric): Secondary | ICD-10-CM | POA: Diagnosis not present

## 2022-11-04 DIAGNOSIS — I129 Hypertensive chronic kidney disease with stage 1 through stage 4 chronic kidney disease, or unspecified chronic kidney disease: Secondary | ICD-10-CM | POA: Diagnosis not present

## 2022-11-07 DIAGNOSIS — N1832 Chronic kidney disease, stage 3b: Secondary | ICD-10-CM | POA: Diagnosis not present

## 2022-11-07 DIAGNOSIS — N2581 Secondary hyperparathyroidism of renal origin: Secondary | ICD-10-CM | POA: Diagnosis not present

## 2022-11-07 DIAGNOSIS — E1122 Type 2 diabetes mellitus with diabetic chronic kidney disease: Secondary | ICD-10-CM | POA: Diagnosis not present

## 2022-11-07 DIAGNOSIS — E1140 Type 2 diabetes mellitus with diabetic neuropathy, unspecified: Secondary | ICD-10-CM | POA: Diagnosis not present

## 2022-11-13 ENCOUNTER — Ambulatory Visit: Payer: Non-veteran care | Admitting: Urology

## 2022-11-13 DIAGNOSIS — N3941 Urge incontinence: Secondary | ICD-10-CM | POA: Diagnosis not present

## 2022-11-13 DIAGNOSIS — R350 Frequency of micturition: Secondary | ICD-10-CM

## 2022-11-29 DIAGNOSIS — J449 Chronic obstructive pulmonary disease, unspecified: Secondary | ICD-10-CM | POA: Diagnosis not present

## 2022-11-29 DIAGNOSIS — I10 Essential (primary) hypertension: Secondary | ICD-10-CM | POA: Diagnosis not present

## 2022-12-06 ENCOUNTER — Telehealth: Payer: Self-pay | Admitting: *Deleted

## 2022-12-06 NOTE — Telephone Encounter (Signed)
Pt has an upcoming appointment on 12/09/22.    Request for patient to stop medication prior to procedure or is needing cleareance  12/06/22  Craig Black [birth year]  What type of surgery is being performed? Colonoscopy/Esophagogastroduodenoscopy (EGD)  When is surgery scheduled? TBD  What type of clearance is required (medical or pharmacy to hold medication or both? medical  Patient needs to have cardiac clearance prior to scheduling procedures. He has an upcoming appointment on 12/09/22.  Name of physician performing surgery?  Dr.Rourk [facility]enterology at [employer]: [phone] Fax: [phone]  Anethesia type (none, local, MAC, general)? MAC

## 2022-12-06 NOTE — Telephone Encounter (Signed)
   Name: Craig Black  DOB: 1945-10-27  MRN: [id number]  Primary Cardiologist: None  Chart reviewed as part of pre-operative protocol coverage. The patient has an upcoming visit scheduled with Dr. Raynelle Jan on 12/09/2022 at which time clearance can be addressed in case there are any issues that would impact surgical recommendations.  I added preop FYI to appointment note so that provider is aware to address at time of outpatient visit.  Per office protocol the cardiology provider should forward their finalized clearance decision and recommendations regarding antiplatelet therapy to the requesting party below.    I will route this message as FYI to requesting party and remove this message from the preop box as separate preop APP input not needed at this time.   Please call with any questions.  Napoleon Form, Leodis Rains, NP  12/06/2022, 11:00 AM

## 2022-12-08 NOTE — Progress Notes (Deleted)
Cardiology Office Note:    Date:  12/08/2022   ID:  Craig Black, DOB [birth year], MRN [id number]  PCP:  [facility]   [facility] Providers Cardiologist:  Christell Constant, MD     Referring MD: [facility]   CC: HFpEF f/u History of Present Illness:    Craig Black is a 77 y.o. male with a hx of Long standing persistent AF, HTN with DM, HFpEF, OSA , COPD on 2 L O2, and tobacco abuse, HLD with DM and CKD stage IIIB seen by Dr. Wolfgang Black. Echo 05/2021 with Severe biatrial dilation  2024: Worsening SOB.  Started SGLT2i.  Notes he smoked since age 68; he was not amenable to stopping.  Patient notes that he is doing ***.   Since last visit notes *** . There are no*** interval hospital/ED visit.   EKG showed ***  No chest pain or pressure ***.  No SOB/DOE*** and no PND/Orthopnea***.  No weight gain or leg swelling***.  No palpitations or syncope ***.  Ambulatory blood pressure ***.    Past Medical History:  Diagnosis Date   Arthritis    Chronic kidney disease    Chronic sinusitis    COPD (chronic obstructive pulmonary disease) (HCC)    Diabetes mellitus    Gout    Hard of hearing    Right ear   Hyperchloremia    Hypertension    Murmur    Neuropathy in diabetes (HCC)    Obesity    OSA (obstructive sleep apnea)    Paroxysmal A-fib (HCC)    Tobacco abuse     Past Surgical History:  Procedure Laterality Date   APPENDECTOMY     cataract     right lens replacement   EXTRACORPOREAL SHOCK WAVE LITHOTRIPSY Left 12/21/2019   Procedure: EXTRACORPOREAL SHOCK WAVE LITHOTRIPSY (ESWL);  Surgeon: Malen Gauze, MD;  Location: [facility];  Service: Urology;  Laterality: Left;   HERNIA REPAIR     SINUS EXPLORATION     1998    Current Medications: No outpatient medications have been marked as taking for the 12/09/22 encounter (Appointment) with Christell Constant, MD.     Allergies:   Patient has no known allergies.   Social History    Socioeconomic History   Marital status: Widowed    Spouse name: Not on file   Number of children: Not on file   Years of education: Not on file   Highest education level: Not on file  Occupational History   Occupation: retired  Tobacco Use   Smoking status: Every Day    Types: Cigars    Passive exposure: Current   Smokeless tobacco: Never   Tobacco comments:    4-5 cigars daily  Vaping Use   Vaping status: Never Used  Substance and Sexual Activity   Alcohol use: Not Currently   Drug use: No   Sexual activity: Not on file  Other Topics Concern   Not on file  Social History Narrative   Not on file   Social Determinants of Health   Financial Resource Strain: Not on file  Food Insecurity: Not on file  Transportation Needs: Not on file  Physical Activity: Not on file  Stress: Not on file  Social Connections: Not on file     Family History: The patient's family history is negative for Colon cancer and Colon polyps. He was adopted.  ROS:   Please see the history of present illness.  Cardiology Office Note:    Date:  12/08/2022   ID:  Craig Black, DOB [birth year], MRN [id number]  [facility]  [facility]   [facility] Providers Cardiologist:  Christell Constant, MD     Referring MD: [facility]   CC: HFpEF f/u History of Present Illness:    Craig Black is a 77 y.o. male with a hx of Long standing persistent AF, HTN with DM, HFpEF, OSA , COPD on 2 L O2, and tobacco abuse, HLD with DM and CKD stage IIIB seen by Dr. Wolfgang Black. Echo 05/2021 with Severe biatrial dilation  2024: Worsening SOB.  Started SGLT2i.  Notes he smoked since age 68; he was not amenable to stopping.  Patient notes that he is doing ***.   Since last visit notes *** . There are no*** interval hospital/ED visit.   EKG showed ***  No chest pain or pressure ***.  No SOB/DOE*** and no PND/Orthopnea***.  No weight gain or leg swelling***.  No palpitations or syncope ***.  Ambulatory blood pressure ***.    Past Medical History:  Diagnosis Date   Arthritis    Chronic kidney disease    Chronic sinusitis    COPD (chronic obstructive pulmonary disease) (HCC)    Diabetes mellitus    Gout    Hard of hearing    Right ear   Hyperchloremia    Hypertension    Murmur    Neuropathy in diabetes (HCC)    Obesity    OSA (obstructive sleep apnea)    Paroxysmal A-fib (HCC)    Tobacco abuse     Past Surgical History:  Procedure Laterality Date   APPENDECTOMY     cataract     right lens replacement   EXTRACORPOREAL SHOCK WAVE LITHOTRIPSY Left 12/21/2019   Procedure: EXTRACORPOREAL SHOCK WAVE LITHOTRIPSY (ESWL);  Surgeon: Malen Gauze, MD;  Location: [facility];  Service: Urology;  Laterality: Left;   HERNIA REPAIR     SINUS EXPLORATION     1998    Current Medications: No outpatient medications have been marked as taking for the 12/09/22 encounter (Appointment) with Christell Constant, MD.     Allergies:   Patient has no known allergies.   Social History    Socioeconomic History   Marital status: Widowed    Spouse name: Not on file   Number of children: Not on file   Years of education: Not on file   Highest education level: Not on file  Occupational History   Occupation: retired  Tobacco Use   Smoking status: Every Day    Types: Cigars    Passive exposure: Current   Smokeless tobacco: Never   Tobacco comments:    4-5 cigars daily  Vaping Use   Vaping status: Never Used  Substance and Sexual Activity   Alcohol use: Not Currently   Drug use: No   Sexual activity: Not on file  Other Topics Concern   Not on file  Social History Narrative   Not on file   Social Determinants of Health   Financial Resource Strain: Not on file  Food Insecurity: Not on file  Transportation Needs: Not on file  Physical Activity: Not on file  Stress: Not on file  Social Connections: Not on file     Family History: The patient's family history is negative for Colon cancer and Colon polyps. He was adopted.  ROS:   Please see the history of present illness.  EKGs/Labs/Other Studies Reviewed:    Cardiac Studies & Procedures       ECHOCARDIOGRAM  ECHOCARDIOGRAM COMPLETE 05/22/2021  Narrative ECHOCARDIOGRAM REPORT    Patient Name:   Craig Black [facility] Date of Exam: 05/22/2021 Medical Rec #:  [id number]     Height:       66.0 in Accession #:    [id number]    Weight:       257.5 lb Date of Birth:  July 26, 1945     BSA:          2.227 m Patient Age:    75 years      BP:           144/65 mmHg Patient Gender: M             HR:           84 bpm. [facility]  Procedure: 2D Echo, Cardiac Doppler and Color Doppler  Indications:    CHF-Acute Diastolic I50.31  History:        Patient has prior history of Echocardiogram examinations, most recent 10/29/2019. COPD, Arrythmias:Atrial Fibrillation; Risk Factors:Hypertension, Diabetes and Current Smoker. OSA (obstructive sleep apnea), Obesity.  Sonographer:    Celesta Gentile  RCS Referring Phys: [phone] Heloise Beecham [place]  IMPRESSIONS   1. Left ventricular ejection fraction, by estimation, is 60 to 65%. The left ventricle has normal function. The left ventricle has no regional wall motion abnormalities. There is moderate concentric left ventricular hypertrophy. Left ventricular diastolic parameters are indeterminate. 2. Right ventricular systolic function is normal. The right ventricular size is normal. Tricuspid regurgitation signal is inadequate for assessing PA pressure. 3. Left atrial size was severely dilated. 4. Right atrial size was severely dilated. 5. The mitral valve is grossly normal. No evidence of mitral valve regurgitation. No evidence of mitral stenosis. 6. The aortic valve was not well visualized. Aortic valve regurgitation is not visualized. No aortic stenosis is present. 7. The inferior vena cava is dilated in size with >50% respiratory variability, suggesting right atrial pressure of 8 mmHg.  Comparison(s): Further atrial dilation from prior.  FINDINGS Left Ventricle: Left ventricular ejection fraction, by estimation, is 60 to 65%. The left ventricle has normal function. The left ventricle has no regional wall motion abnormalities. Definity contrast agent was given IV to delineate the left ventricular endocardial borders. The left ventricular internal cavity size was normal in size. There is moderate concentric left ventricular hypertrophy. Left ventricular diastolic parameters are indeterminate.  Right Ventricle: The right ventricular size is normal. No increase in right ventricular wall thickness. Right ventricular systolic function is normal. Tricuspid regurgitation signal is inadequate for assessing PA pressure.  Left Atrium: Left atrial size was severely dilated.  Right Atrium: Right atrial size was severely dilated.  Pericardium: There is no evidence of pericardial effusion. Presence of epicardial fat layer.  Mitral Valve: The mitral  valve is grossly normal. No evidence of mitral valve regurgitation. No evidence of mitral valve stenosis.  Tricuspid Valve: The tricuspid valve is grossly normal. Tricuspid valve regurgitation is not demonstrated. No evidence of tricuspid stenosis.  Aortic Valve: The aortic valve was not well visualized. Aortic valve regurgitation is not visualized. No aortic stenosis is present.  Pulmonic Valve: The pulmonic valve was not well visualized. Pulmonic valve regurgitation is not visualized. No evidence of pulmonic stenosis.  Aorta: The aortic root is normal in size and structure.  Venous: The inferior vena cava is dilated in size with greater than 50% respiratory  EKGs/Labs/Other Studies Reviewed:    Cardiac Studies & Procedures       ECHOCARDIOGRAM  ECHOCARDIOGRAM COMPLETE 05/22/2021  Narrative ECHOCARDIOGRAM REPORT    Patient Name:   Craig Black [facility] Date of Exam: 05/22/2021 Medical Rec #:  [id number]     Height:       66.0 in Accession #:    [id number]    Weight:       257.5 lb Date of Birth:  July 26, 1945     BSA:          2.227 m Patient Age:    75 years      BP:           144/65 mmHg Patient Gender: M             HR:           84 bpm. [facility]  Procedure: 2D Echo, Cardiac Doppler and Color Doppler  Indications:    CHF-Acute Diastolic I50.31  History:        Patient has prior history of Echocardiogram examinations, most recent 10/29/2019. COPD, Arrythmias:Atrial Fibrillation; Risk Factors:Hypertension, Diabetes and Current Smoker. OSA (obstructive sleep apnea), Obesity.  Sonographer:    Celesta Gentile  RCS Referring Phys: [phone] Heloise Beecham [place]  IMPRESSIONS   1. Left ventricular ejection fraction, by estimation, is 60 to 65%. The left ventricle has normal function. The left ventricle has no regional wall motion abnormalities. There is moderate concentric left ventricular hypertrophy. Left ventricular diastolic parameters are indeterminate. 2. Right ventricular systolic function is normal. The right ventricular size is normal. Tricuspid regurgitation signal is inadequate for assessing PA pressure. 3. Left atrial size was severely dilated. 4. Right atrial size was severely dilated. 5. The mitral valve is grossly normal. No evidence of mitral valve regurgitation. No evidence of mitral stenosis. 6. The aortic valve was not well visualized. Aortic valve regurgitation is not visualized. No aortic stenosis is present. 7. The inferior vena cava is dilated in size with >50% respiratory variability, suggesting right atrial pressure of 8 mmHg.  Comparison(s): Further atrial dilation from prior.  FINDINGS Left Ventricle: Left ventricular ejection fraction, by estimation, is 60 to 65%. The left ventricle has normal function. The left ventricle has no regional wall motion abnormalities. Definity contrast agent was given IV to delineate the left ventricular endocardial borders. The left ventricular internal cavity size was normal in size. There is moderate concentric left ventricular hypertrophy. Left ventricular diastolic parameters are indeterminate.  Right Ventricle: The right ventricular size is normal. No increase in right ventricular wall thickness. Right ventricular systolic function is normal. Tricuspid regurgitation signal is inadequate for assessing PA pressure.  Left Atrium: Left atrial size was severely dilated.  Right Atrium: Right atrial size was severely dilated.  Pericardium: There is no evidence of pericardial effusion. Presence of epicardial fat layer.  Mitral Valve: The mitral  valve is grossly normal. No evidence of mitral valve regurgitation. No evidence of mitral valve stenosis.  Tricuspid Valve: The tricuspid valve is grossly normal. Tricuspid valve regurgitation is not demonstrated. No evidence of tricuspid stenosis.  Aortic Valve: The aortic valve was not well visualized. Aortic valve regurgitation is not visualized. No aortic stenosis is present.  Pulmonic Valve: The pulmonic valve was not well visualized. Pulmonic valve regurgitation is not visualized. No evidence of pulmonic stenosis.  Aorta: The aortic root is normal in size and structure.  Venous: The inferior vena cava is dilated in size with greater than 50% respiratory

## 2022-12-09 ENCOUNTER — Ambulatory Visit: Payer: Medicare HMO | Attending: Internal Medicine | Admitting: Internal Medicine

## 2022-12-10 ENCOUNTER — Telehealth: Payer: Self-pay | Admitting: Urology

## 2022-12-10 ENCOUNTER — Encounter: Payer: Self-pay | Admitting: Internal Medicine

## 2022-12-10 NOTE — Telephone Encounter (Signed)
Craig Black is calling to see if you got the lab results back yet, they would like to know how Dr Ronne Binning was going to proceed with treatment.

## 2022-12-10 NOTE — Telephone Encounter (Signed)
Spoke with Ms. Craig Black and patient f/u after urodynamics rescheduled.

## 2022-12-30 DIAGNOSIS — I10 Essential (primary) hypertension: Secondary | ICD-10-CM | POA: Diagnosis not present

## 2022-12-30 DIAGNOSIS — J449 Chronic obstructive pulmonary disease, unspecified: Secondary | ICD-10-CM | POA: Diagnosis not present

## 2023-01-07 ENCOUNTER — Telehealth: Payer: Self-pay

## 2023-01-07 ENCOUNTER — Ambulatory Visit: Payer: Non-veteran care | Admitting: Urology

## 2023-01-07 NOTE — Telephone Encounter (Signed)
Patient wants to be seen before his November appointment if at all possible. He continues to have excessive frequent urination.  Please advise.

## 2023-01-08 NOTE — Telephone Encounter (Signed)
Pt had a visit scheduled for 12/09/22 with cardiology and he was a No Show. They mailed letter out for him to reschedule. He has appointment on 04/30/23.  FYI

## 2023-01-08 NOTE — Telephone Encounter (Signed)
Tried calling patient with no answer regarding moving up patient's appointment. Left voiced message for return call to office.

## 2023-01-09 NOTE — Telephone Encounter (Signed)
Patient friend [name] called he is wetting his self more frequently and she would like to speak with nurse

## 2023-01-09 NOTE — Telephone Encounter (Signed)
Patient was made aware that Dr. Ronne Binning have to review his Urodynamics and there is no opening to move patient appointment up sooner. Consulted with Maralyn Sago and Maralyn Sago can not interrupt urodynamics. Patient ask could he see another provider, patient was made aware that was possibly. Patient's girlfriend cuss at me and hung up.

## 2023-01-13 ENCOUNTER — Ambulatory Visit: Payer: Non-veteran care | Admitting: Urology

## 2023-01-29 DIAGNOSIS — J449 Chronic obstructive pulmonary disease, unspecified: Secondary | ICD-10-CM | POA: Diagnosis not present

## 2023-01-29 DIAGNOSIS — I10 Essential (primary) hypertension: Secondary | ICD-10-CM | POA: Diagnosis not present

## 2023-02-07 DIAGNOSIS — N1832 Chronic kidney disease, stage 3b: Secondary | ICD-10-CM | POA: Diagnosis not present

## 2023-02-07 DIAGNOSIS — D631 Anemia in chronic kidney disease: Secondary | ICD-10-CM | POA: Diagnosis not present

## 2023-02-07 DIAGNOSIS — E1122 Type 2 diabetes mellitus with diabetic chronic kidney disease: Secondary | ICD-10-CM | POA: Diagnosis not present

## 2023-02-07 DIAGNOSIS — R809 Proteinuria, unspecified: Secondary | ICD-10-CM | POA: Diagnosis not present

## 2023-02-07 DIAGNOSIS — E1140 Type 2 diabetes mellitus with diabetic neuropathy, unspecified: Secondary | ICD-10-CM | POA: Diagnosis not present

## 2023-02-07 DIAGNOSIS — N189 Chronic kidney disease, unspecified: Secondary | ICD-10-CM | POA: Diagnosis not present

## 2023-02-19 DIAGNOSIS — Z1389 Encounter for screening for other disorder: Secondary | ICD-10-CM | POA: Diagnosis not present

## 2023-02-19 DIAGNOSIS — F17210 Nicotine dependence, cigarettes, uncomplicated: Secondary | ICD-10-CM | POA: Diagnosis not present

## 2023-02-19 DIAGNOSIS — Z0001 Encounter for general adult medical examination with abnormal findings: Secondary | ICD-10-CM | POA: Diagnosis not present

## 2023-02-19 DIAGNOSIS — Z9989 Dependence on other enabling machines and devices: Secondary | ICD-10-CM | POA: Diagnosis not present

## 2023-02-19 DIAGNOSIS — Z1331 Encounter for screening for depression: Secondary | ICD-10-CM | POA: Diagnosis not present

## 2023-02-19 DIAGNOSIS — E1142 Type 2 diabetes mellitus with diabetic polyneuropathy: Secondary | ICD-10-CM | POA: Diagnosis not present

## 2023-02-19 DIAGNOSIS — I10 Essential (primary) hypertension: Secondary | ICD-10-CM | POA: Diagnosis not present

## 2023-02-19 DIAGNOSIS — E1165 Type 2 diabetes mellitus with hyperglycemia: Secondary | ICD-10-CM | POA: Diagnosis not present

## 2023-02-19 DIAGNOSIS — I4821 Permanent atrial fibrillation: Secondary | ICD-10-CM | POA: Diagnosis not present

## 2023-02-21 ENCOUNTER — Other Ambulatory Visit (HOSPITAL_COMMUNITY): Payer: Self-pay | Admitting: Gerontology

## 2023-02-21 DIAGNOSIS — F17210 Nicotine dependence, cigarettes, uncomplicated: Secondary | ICD-10-CM

## 2023-03-07 ENCOUNTER — Ambulatory Visit: Payer: Non-veteran care | Admitting: Urology

## 2023-03-21 DIAGNOSIS — J449 Chronic obstructive pulmonary disease, unspecified: Secondary | ICD-10-CM | POA: Diagnosis not present

## 2023-03-21 DIAGNOSIS — I10 Essential (primary) hypertension: Secondary | ICD-10-CM | POA: Diagnosis not present

## 2023-04-21 DIAGNOSIS — I10 Essential (primary) hypertension: Secondary | ICD-10-CM | POA: Diagnosis not present

## 2023-04-21 DIAGNOSIS — J449 Chronic obstructive pulmonary disease, unspecified: Secondary | ICD-10-CM | POA: Diagnosis not present

## 2023-04-30 ENCOUNTER — Ambulatory Visit: Payer: Medicare HMO | Attending: Internal Medicine | Admitting: Internal Medicine

## 2023-04-30 NOTE — Progress Notes (Deleted)
Cardiology Office Note:    Date:  04/30/2023   ID:  Craig Black, DOB 1946/04/04, MRN [id number]  PCP:  [facility]   [facility] Providers Cardiologist:  Craig Constant, MD     Referring MD: Black, Craig Black   CC: Not sure why he is here Consulted for the evaluation of SOB at the behest of [facility]  History of Present Illness:    Craig Black is a 78 y.o. male with a hx of Long standing persistent AF, HTN with DM, HFpEF, OSA , COPD on 2 L O2, and tobacco abuse, HLD with DM and CKD stage IIIB seen by Dr. Wolfgang Black. Echo 05/2021 with Severe biatrial dilation   Patient notes that he is feeling fine.  Has had no chest pain, chest pressure, chest tightness, chest stinging.  Patient exertion notable for minimal (he notes he is largely a couch potato).  No shortness of breath. He notes that he wears his O2 all the time now but he doesn't like lugging it around. No PND or orthopnea.  Notes that he ate fatback today and that may explain elevated BP.  No syncope or near syncope . Notes  no palpitations or funny heart beats.   He is asymptomatic from AF.  20 years ago has palpitations from it.  He has smoked since 78 year old.  Is pre-contemplative to quit.    Past Black History:  Diagnosis Date   Arthritis    Chronic kidney disease    Chronic sinusitis    COPD (chronic obstructive pulmonary disease) (HCC)    Diabetes mellitus    Gout    Hard of hearing    Right ear   Hyperchloremia    Hypertension    Murmur    Neuropathy in diabetes (HCC)    Obesity    OSA (obstructive sleep apnea)    Paroxysmal A-fib (HCC)    Tobacco abuse     Past Surgical History:  Procedure Laterality Date   APPENDECTOMY     cataract     right lens replacement   EXTRACORPOREAL SHOCK WAVE LITHOTRIPSY Left 12/21/2019   Procedure: EXTRACORPOREAL SHOCK WAVE LITHOTRIPSY (ESWL);  Surgeon: Craig Gauze, MD;  Location: [facility];  Service: Urology;  Laterality:  Left;   HERNIA REPAIR     SINUS EXPLORATION     1998    Current Medications: No outpatient medications have been marked as taking for the 04/30/23 encounter (Appointment) with Craig Constant, MD.     Allergies:   Patient has no known allergies.   Social History   Socioeconomic History   Marital status: Widowed    Spouse name: Not on file   Number of children: Not on file   Years of education: Not on file   Highest education level: Not on file  Occupational History   Occupation: retired  Tobacco Use   Smoking status: Every Day    Types: Cigars    Passive exposure: Current   Smokeless tobacco: Never   Tobacco comments:    4-5 cigars daily  Vaping Use   Vaping status: Never Used  Substance and Sexual Activity   Alcohol use: Not Currently   Drug use: No   Sexual activity: Not on file  Other Topics Concern   Not on file  Social History Narrative   Not on file   Social Drivers of Health   Financial Resource Strain: Not on file  Food Insecurity: Not  on file  Transportation Needs: Not on file  Physical Activity: Not on file  Stress: Not on file  Social Connections: Not on file     Family History: The patient's family history is negative for Colon cancer and Colon polyps. He was adopted.  ROS:   Please see the history of present illness.     All other systems reviewed and are negative.  EKGs/Labs/Other Studies Reviewed:     Recent Labs: No results found for requested labs within last 365 days.  Recent Lipid Panel    Component Value Date/Time   CHOL 98 03/12/2021 1602   TRIG 134 03/12/2021 1602   HDL 38 (L) 03/12/2021 1602   CHOLHDL 2.6 03/12/2021 1602   VLDL 27 03/12/2021 1602   LDLCALC 33 03/12/2021 1602        Physical Exam:    VS:  There were no vitals taken for this visit.    Wt Readings from Last 3 Encounters:  10/08/22 282 lb 6.4 oz (128.1 kg)  03/06/22 253 lb 14.4 oz (115.2 kg)  08/22/21 250 lb (113.4 kg)    Gen: No distress,  morbid obesity, smells of smoke Neck: No JVD Ears: Bilateral Craig Black Sign Cardiac: No Rubs or Gallops, no Murmur,  IRIR +2 radial pulses Respiratory: Clear to auscultation bilaterally, normal effort, normal  respiratory rate GI: Soft, nontender, non-distended  MS: +1 R leg, +2 L leg edema;  moves all extremities Integument: Skin feels warm Neuro:  At time of evaluation, alert and oriented to person/place/time/situation  Psych: Normal affect, patient feels well   ASSESSMENT:    No diagnosis found.  PLAN:    Long standing persistent AF-> Permanent AF - CHASDVASC 5 HTN with DM HFpEF OSA on O2 no CPAP Morbid Obesity COPD and tobacco abuse HLD with DM CKD stage IIIB seen by Dr. Wolfgang Black. - patient to double up on his lasix for three days then back to 40 mg daily; he is to back off the salty meats - continue eliquis - patient is precontemplative for smoking cessation; no smoking while on O2 - continue coreg 6.25 mg PO daily - Continue norvasc 10 - reaching out to our Pharm D Teammates would like to start Jardiance 10 mg  Fall f/u       Medication Adjustments/Labs and Tests Ordered: Current medicines are reviewed at length with the patient today.  Concerns regarding medicines are outlined above.  No orders of the defined types were placed in this encounter.  No orders of the defined types were placed in this encounter.   There are no Patient Instructions on file for this visit.   Signed, Craig Constant, MD  04/30/2023 10:09 AM    [facility]

## 2023-05-01 ENCOUNTER — Encounter: Payer: Self-pay | Admitting: Internal Medicine

## 2023-05-01 DIAGNOSIS — I4821 Permanent atrial fibrillation: Secondary | ICD-10-CM | POA: Diagnosis not present

## 2023-05-01 DIAGNOSIS — I10 Essential (primary) hypertension: Secondary | ICD-10-CM | POA: Diagnosis not present

## 2023-05-01 DIAGNOSIS — Z0001 Encounter for general adult medical examination with abnormal findings: Secondary | ICD-10-CM | POA: Diagnosis not present

## 2023-05-01 DIAGNOSIS — D631 Anemia in chronic kidney disease: Secondary | ICD-10-CM | POA: Diagnosis not present

## 2023-05-01 DIAGNOSIS — R809 Proteinuria, unspecified: Secondary | ICD-10-CM | POA: Diagnosis not present

## 2023-05-01 DIAGNOSIS — E1129 Type 2 diabetes mellitus with other diabetic kidney complication: Secondary | ICD-10-CM | POA: Diagnosis not present

## 2023-05-01 DIAGNOSIS — E211 Secondary hyperparathyroidism, not elsewhere classified: Secondary | ICD-10-CM | POA: Diagnosis not present

## 2023-05-01 DIAGNOSIS — N189 Chronic kidney disease, unspecified: Secondary | ICD-10-CM | POA: Diagnosis not present

## 2023-05-05 DIAGNOSIS — G4733 Obstructive sleep apnea (adult) (pediatric): Secondary | ICD-10-CM | POA: Diagnosis not present

## 2023-05-05 DIAGNOSIS — N1832 Chronic kidney disease, stage 3b: Secondary | ICD-10-CM | POA: Diagnosis not present

## 2023-05-05 DIAGNOSIS — E1122 Type 2 diabetes mellitus with diabetic chronic kidney disease: Secondary | ICD-10-CM | POA: Diagnosis not present

## 2023-05-05 DIAGNOSIS — I129 Hypertensive chronic kidney disease with stage 1 through stage 4 chronic kidney disease, or unspecified chronic kidney disease: Secondary | ICD-10-CM | POA: Diagnosis not present

## 2023-05-22 DIAGNOSIS — J449 Chronic obstructive pulmonary disease, unspecified: Secondary | ICD-10-CM | POA: Diagnosis not present

## 2023-05-22 DIAGNOSIS — I10 Essential (primary) hypertension: Secondary | ICD-10-CM | POA: Diagnosis not present

## 2023-06-12 DIAGNOSIS — H5213 Myopia, bilateral: Secondary | ICD-10-CM | POA: Diagnosis not present

## 2023-06-12 DIAGNOSIS — H524 Presbyopia: Secondary | ICD-10-CM | POA: Diagnosis not present

## 2023-06-19 DIAGNOSIS — J449 Chronic obstructive pulmonary disease, unspecified: Secondary | ICD-10-CM | POA: Diagnosis not present

## 2023-06-19 DIAGNOSIS — I10 Essential (primary) hypertension: Secondary | ICD-10-CM | POA: Diagnosis not present

## 2023-07-20 DIAGNOSIS — J449 Chronic obstructive pulmonary disease, unspecified: Secondary | ICD-10-CM | POA: Diagnosis not present

## 2023-07-20 DIAGNOSIS — I10 Essential (primary) hypertension: Secondary | ICD-10-CM | POA: Diagnosis not present

## 2023-08-07 DIAGNOSIS — R809 Proteinuria, unspecified: Secondary | ICD-10-CM | POA: Diagnosis not present

## 2023-08-07 DIAGNOSIS — E211 Secondary hyperparathyroidism, not elsewhere classified: Secondary | ICD-10-CM | POA: Diagnosis not present

## 2023-08-07 DIAGNOSIS — N189 Chronic kidney disease, unspecified: Secondary | ICD-10-CM | POA: Diagnosis not present

## 2023-08-07 DIAGNOSIS — D631 Anemia in chronic kidney disease: Secondary | ICD-10-CM | POA: Diagnosis not present

## 2023-08-11 DIAGNOSIS — N2581 Secondary hyperparathyroidism of renal origin: Secondary | ICD-10-CM | POA: Diagnosis not present

## 2023-08-11 DIAGNOSIS — I129 Hypertensive chronic kidney disease with stage 1 through stage 4 chronic kidney disease, or unspecified chronic kidney disease: Secondary | ICD-10-CM | POA: Diagnosis not present

## 2023-08-11 DIAGNOSIS — N1832 Chronic kidney disease, stage 3b: Secondary | ICD-10-CM | POA: Diagnosis not present

## 2023-08-11 DIAGNOSIS — E1122 Type 2 diabetes mellitus with diabetic chronic kidney disease: Secondary | ICD-10-CM | POA: Diagnosis not present

## 2023-08-20 DIAGNOSIS — J449 Chronic obstructive pulmonary disease, unspecified: Secondary | ICD-10-CM | POA: Diagnosis not present

## 2023-08-20 DIAGNOSIS — E1165 Type 2 diabetes mellitus with hyperglycemia: Secondary | ICD-10-CM | POA: Diagnosis not present

## 2023-08-20 DIAGNOSIS — N1832 Chronic kidney disease, stage 3b: Secondary | ICD-10-CM | POA: Diagnosis not present

## 2023-08-20 DIAGNOSIS — I4821 Permanent atrial fibrillation: Secondary | ICD-10-CM | POA: Diagnosis not present

## 2023-08-20 DIAGNOSIS — I10 Essential (primary) hypertension: Secondary | ICD-10-CM | POA: Diagnosis not present

## 2023-09-20 DIAGNOSIS — J449 Chronic obstructive pulmonary disease, unspecified: Secondary | ICD-10-CM | POA: Diagnosis not present

## 2023-09-20 DIAGNOSIS — I10 Essential (primary) hypertension: Secondary | ICD-10-CM | POA: Diagnosis not present

## 2023-09-29 DIAGNOSIS — D631 Anemia in chronic kidney disease: Secondary | ICD-10-CM | POA: Diagnosis not present

## 2023-09-29 DIAGNOSIS — N189 Chronic kidney disease, unspecified: Secondary | ICD-10-CM | POA: Diagnosis not present

## 2023-09-29 DIAGNOSIS — E211 Secondary hyperparathyroidism, not elsewhere classified: Secondary | ICD-10-CM | POA: Diagnosis not present

## 2023-09-29 DIAGNOSIS — R809 Proteinuria, unspecified: Secondary | ICD-10-CM | POA: Diagnosis not present

## 2023-10-01 DIAGNOSIS — I10 Essential (primary) hypertension: Secondary | ICD-10-CM | POA: Diagnosis not present

## 2023-10-01 DIAGNOSIS — R030 Elevated blood-pressure reading, without diagnosis of hypertension: Secondary | ICD-10-CM | POA: Diagnosis not present

## 2023-10-01 DIAGNOSIS — M5416 Radiculopathy, lumbar region: Secondary | ICD-10-CM | POA: Diagnosis not present

## 2023-10-01 DIAGNOSIS — M62838 Other muscle spasm: Secondary | ICD-10-CM | POA: Diagnosis not present

## 2023-10-01 DIAGNOSIS — Z6841 Body Mass Index (BMI) 40.0 and over, adult: Secondary | ICD-10-CM | POA: Diagnosis not present

## 2023-10-01 DIAGNOSIS — M129 Arthropathy, unspecified: Secondary | ICD-10-CM | POA: Diagnosis not present

## 2023-10-01 DIAGNOSIS — Z79899 Other long term (current) drug therapy: Secondary | ICD-10-CM | POA: Diagnosis not present

## 2023-10-06 DIAGNOSIS — I129 Hypertensive chronic kidney disease with stage 1 through stage 4 chronic kidney disease, or unspecified chronic kidney disease: Secondary | ICD-10-CM | POA: Diagnosis not present

## 2023-10-06 DIAGNOSIS — E1122 Type 2 diabetes mellitus with diabetic chronic kidney disease: Secondary | ICD-10-CM | POA: Diagnosis not present

## 2023-10-06 DIAGNOSIS — N1832 Chronic kidney disease, stage 3b: Secondary | ICD-10-CM | POA: Diagnosis not present

## 2023-10-06 DIAGNOSIS — G4733 Obstructive sleep apnea (adult) (pediatric): Secondary | ICD-10-CM | POA: Diagnosis not present

## 2023-10-20 DIAGNOSIS — I10 Essential (primary) hypertension: Secondary | ICD-10-CM | POA: Diagnosis not present

## 2023-10-20 DIAGNOSIS — J449 Chronic obstructive pulmonary disease, unspecified: Secondary | ICD-10-CM | POA: Diagnosis not present

## 2023-10-21 DIAGNOSIS — N189 Chronic kidney disease, unspecified: Secondary | ICD-10-CM | POA: Diagnosis not present

## 2023-11-20 DIAGNOSIS — I10 Essential (primary) hypertension: Secondary | ICD-10-CM | POA: Diagnosis not present

## 2023-11-20 DIAGNOSIS — J449 Chronic obstructive pulmonary disease, unspecified: Secondary | ICD-10-CM | POA: Diagnosis not present

## 2023-12-21 DIAGNOSIS — I10 Essential (primary) hypertension: Secondary | ICD-10-CM | POA: Diagnosis not present

## 2023-12-21 DIAGNOSIS — J449 Chronic obstructive pulmonary disease, unspecified: Secondary | ICD-10-CM | POA: Diagnosis not present

## 2024-01-20 DIAGNOSIS — I10 Essential (primary) hypertension: Secondary | ICD-10-CM | POA: Diagnosis not present

## 2024-01-20 DIAGNOSIS — J449 Chronic obstructive pulmonary disease, unspecified: Secondary | ICD-10-CM | POA: Diagnosis not present
